# Patient Record
Sex: Female | Born: 1950 | Race: White | Hispanic: No | Marital: Married | Smoking: Former smoker
Health system: Southern US, Community
[De-identification: ages and names within clinical notes are randomized; demographics above are authoritative.]

## PROBLEM LIST (undated history)

## (undated) DIAGNOSIS — M8580 Other specified disorders of bone density and structure, unspecified site: Secondary | ICD-10-CM

## (undated) DIAGNOSIS — J189 Pneumonia, unspecified organism: Secondary | ICD-10-CM

## (undated) DIAGNOSIS — G5602 Carpal tunnel syndrome, left upper limb: Secondary | ICD-10-CM

## (undated) DIAGNOSIS — M1990 Unspecified osteoarthritis, unspecified site: Secondary | ICD-10-CM

## (undated) DIAGNOSIS — G589 Mononeuropathy, unspecified: Secondary | ICD-10-CM

## (undated) DIAGNOSIS — Z8669 Personal history of other diseases of the nervous system and sense organs: Secondary | ICD-10-CM

## (undated) DIAGNOSIS — F8081 Childhood onset fluency disorder: Secondary | ICD-10-CM

## (undated) DIAGNOSIS — J45909 Unspecified asthma, uncomplicated: Secondary | ICD-10-CM

## (undated) HISTORY — PX: TUBAL LIGATION: SHX77

## (undated) HISTORY — PX: KNEE ARTHROSCOPY: SUR90

## (undated) HISTORY — PX: TONSILLECTOMY AND ADENOIDECTOMY: SUR1326

## (undated) HISTORY — PX: SHOULDER SURGERY: SHX246

## (undated) HISTORY — DX: Other specified disorders of bone density and structure, unspecified site: M85.80

## (undated) HISTORY — PX: TRIGGER FINGER RELEASE: SHX641

## (undated) HISTORY — PX: COLONOSCOPY: SHX174

## (undated) HISTORY — PX: ANKLE FRACTURE SURGERY: SHX122

## (undated) HISTORY — PX: CHOLECYSTECTOMY: SHX55

## (undated) HISTORY — PX: DILATION AND CURETTAGE OF UTERUS: SHX78

## (undated) HISTORY — PX: CARPAL TUNNEL RELEASE: SHX101

---

## 1998-09-30 ENCOUNTER — Other Ambulatory Visit: Admission: RE | Admit: 1998-09-30 | Discharge: 1998-09-30 | Payer: Self-pay | Admitting: Gynecology

## 2000-01-11 ENCOUNTER — Other Ambulatory Visit: Admission: RE | Admit: 2000-01-11 | Discharge: 2000-01-11 | Payer: Self-pay | Admitting: Gynecology

## 2001-01-26 ENCOUNTER — Other Ambulatory Visit: Admission: RE | Admit: 2001-01-26 | Discharge: 2001-01-26 | Payer: Self-pay | Admitting: Gynecology

## 2002-02-23 ENCOUNTER — Other Ambulatory Visit: Admission: RE | Admit: 2002-02-23 | Discharge: 2002-02-23 | Payer: Self-pay | Admitting: Gynecology

## 2003-06-18 ENCOUNTER — Other Ambulatory Visit: Admission: RE | Admit: 2003-06-18 | Discharge: 2003-06-18 | Payer: Self-pay | Admitting: Gynecology

## 2004-09-25 ENCOUNTER — Other Ambulatory Visit: Admission: RE | Admit: 2004-09-25 | Discharge: 2004-09-25 | Payer: Self-pay | Admitting: Gynecology

## 2004-10-02 ENCOUNTER — Ambulatory Visit (HOSPITAL_COMMUNITY): Admission: RE | Admit: 2004-10-02 | Discharge: 2004-10-02 | Payer: Self-pay | Admitting: Gastroenterology

## 2005-04-08 ENCOUNTER — Ambulatory Visit (HOSPITAL_BASED_OUTPATIENT_CLINIC_OR_DEPARTMENT_OTHER): Admission: RE | Admit: 2005-04-08 | Discharge: 2005-04-08 | Payer: Self-pay | Admitting: Orthopedic Surgery

## 2005-04-08 ENCOUNTER — Ambulatory Visit (HOSPITAL_COMMUNITY): Admission: RE | Admit: 2005-04-08 | Discharge: 2005-04-08 | Payer: Self-pay | Admitting: Orthopedic Surgery

## 2006-10-20 ENCOUNTER — Other Ambulatory Visit: Admission: RE | Admit: 2006-10-20 | Discharge: 2006-10-20 | Payer: Self-pay | Admitting: Gynecology

## 2007-11-03 ENCOUNTER — Other Ambulatory Visit: Admission: RE | Admit: 2007-11-03 | Discharge: 2007-11-03 | Payer: Self-pay | Admitting: Gynecology

## 2008-11-07 ENCOUNTER — Encounter: Payer: Self-pay | Admitting: Gynecology

## 2008-11-07 ENCOUNTER — Other Ambulatory Visit: Admission: RE | Admit: 2008-11-07 | Discharge: 2008-11-07 | Payer: Self-pay | Admitting: Gynecology

## 2008-11-07 ENCOUNTER — Ambulatory Visit: Payer: Self-pay | Admitting: Gynecology

## 2008-12-02 ENCOUNTER — Ambulatory Visit: Payer: Self-pay | Admitting: Gynecology

## 2009-11-14 ENCOUNTER — Ambulatory Visit: Payer: Self-pay | Admitting: Gynecology

## 2009-11-14 ENCOUNTER — Other Ambulatory Visit: Admission: RE | Admit: 2009-11-14 | Discharge: 2009-11-14 | Payer: Self-pay | Admitting: Gynecology

## 2010-10-09 NOTE — Op Note (Signed)
NAMEGINI, CAPUTO                 ACCOUNT NO.:  [id number]   MEDICAL RECORD NO.:  [id number]          PATIENT TYPE:  AMB   LOCATION:  DSC                          FACILITY:  [facility]   PHYSICIAN:  Cindee Salt, M.D.       DATE OF BIRTH:  [birth year]   DATE OF PROCEDURE:  04/08/2005  DATE OF DISCHARGE:                                 OPERATIVE REPORT   PREOPERATIVE DIAGNOSIS:  Carpal tunnel syndrome, left hand.   POSTOPERATIVE DIAGNOSIS:  Carpal tunnel syndrome, left hand.   OPERATION:  Decompression, left median nerve.   SURGEON:  Cindee Salt, M.D.   ASSISTANT:  Carolyne Fiscal R.N.   ANESTHESIA:  Forearm-based IV regional.   HISTORY:  The patient is a 60 year old female with a history of carpal  tunnel syndrome, EMG nerve conductions positive, which has not responded to  conservative treatment.   PROCEDURE:  The patient was brought to the operating room, where a forearm-  based IV regional anesthetic was carried out without difficulty.  She was  prepped using DuraPrep, supine position, left arm free.  A longitudinal  incision was made in the palm and carried down through subcutaneous tissue.  Bleeders were electrocauterized.  Palmar fascia was split, superficial  palmar arch identified, flexor tendon to the ring and little finger  identified to the ulnar side of the median nerve.  The carpal retinaculum  was incised with sharp dissection.  A right angle and Sewall retractor were  placed between skin forearm fascia.  The fascia was released for  approximately a 1.5 cm proximal to the wrist crease under direct vision.  An  area of compression to the nerve was immediately apparent.  No further  lesions were identified.  Tenosynovial tissue was moderately thickened.  The  wound was irrigated. The skin was closed with interrupted 5-0 nylon sutures.  A sterile compressive dressing and splint was applied.  The patient  tolerated the procedure well and was taken to the recovery room for  observation in satisfactory condition.  She is discharged home to return to  the [facility] [place] in one week on Vicodin.           ______________________________  Cindee Salt, M.D.     GK/MEDQ  D:  04/08/2005  T:  04/09/2005  Job:  [id number]

## 2010-10-09 NOTE — Op Note (Signed)
NAMEMAKENA, MURDOCK                 ACCOUNT NO.:  [id number]   MEDICAL RECORD NO.:  [id number]          PATIENT TYPE:  AMB   LOCATION:  ENDO                         FACILITY:  [facility]   PHYSICIAN:  Graylin Shiver, M.D.   DATE OF BIRTH:  1950-06-20   DATE OF PROCEDURE:  10/02/2004  DATE OF DISCHARGE:                                 OPERATIVE REPORT   PROCEDURE:  Colonoscopy.   INDICATIONS:  Screening.   Informed consent was obtained after explanation of the risks of bleeding,  infection and perforation.   PREMEDICATION:  Fentanyl 125 mcg IV, Versed 12 mg IV.   PROCEDURE:  With the patient in the left lateral decubitus position, a  rectal exam was performed. No masses were felt. The Olympus colonoscope was  inserted into the rectum and advanced around the colon to the cecum. Cecal  landmarks were identified. The cecum and ascending colon were normal. The  transverse colon normal. The descending colon normal. The sigmoid showed  diverticulosis. The sigmoid was very tortuous. The rectum was normal. She  tolerated the procedure well without complications.   IMPRESSION:  Diverticulosis of the sigmoid colon   I would recommend a follow-up screening colonoscopy again in 10 years.      SFG/MEDQ  D:  10/02/2004  T:  10/02/2004  Job:  [id number]   cc:   Dellis Anes. Idell Pickles, M.D.  [address]  [place]  [place] [address]  Fax: [phone]

## 2010-12-04 ENCOUNTER — Other Ambulatory Visit: Payer: Self-pay | Admitting: Gynecology

## 2010-12-04 ENCOUNTER — Other Ambulatory Visit (HOSPITAL_COMMUNITY)
Admission: RE | Admit: 2010-12-04 | Discharge: 2010-12-04 | Disposition: A | Discharge status: Home / Self Care | Payer: Managed Care, Other (non HMO) | Source: Ambulatory Visit | Attending: Gynecology | Admitting: Gynecology

## 2010-12-04 ENCOUNTER — Encounter: Payer: Self-pay | Admitting: Gynecology

## 2010-12-04 ENCOUNTER — Encounter (INDEPENDENT_AMBULATORY_CARE_PROVIDER_SITE_OTHER): Payer: Managed Care, Other (non HMO) | Admitting: Gynecology

## 2010-12-04 DIAGNOSIS — Z01419 Encounter for gynecological examination (general) (routine) without abnormal findings: Secondary | ICD-10-CM

## 2010-12-04 DIAGNOSIS — N952 Postmenopausal atrophic vaginitis: Secondary | ICD-10-CM

## 2010-12-04 DIAGNOSIS — Z124 Encounter for screening for malignant neoplasm of cervix: Secondary | ICD-10-CM | POA: Insufficient documentation

## 2011-01-12 ENCOUNTER — Ambulatory Visit (HOSPITAL_BASED_OUTPATIENT_CLINIC_OR_DEPARTMENT_OTHER)
Admission: RE | Admit: 2011-01-12 | Discharge: 2011-01-12 | Disposition: A | Discharge status: Home / Self Care | Payer: Managed Care, Other (non HMO) | Source: Ambulatory Visit | Attending: Orthopedic Surgery | Admitting: Orthopedic Surgery

## 2011-01-12 ENCOUNTER — Ambulatory Visit (HOSPITAL_COMMUNITY): Payer: Managed Care, Other (non HMO) | Attending: Orthopedic Surgery

## 2011-01-12 DIAGNOSIS — X58XXXA Exposure to other specified factors, initial encounter: Secondary | ICD-10-CM | POA: Insufficient documentation

## 2011-01-12 DIAGNOSIS — S8253XA Displaced fracture of medial malleolus of unspecified tibia, initial encounter for closed fracture: Secondary | ICD-10-CM | POA: Insufficient documentation

## 2011-01-12 DIAGNOSIS — Z79899 Other long term (current) drug therapy: Secondary | ICD-10-CM | POA: Insufficient documentation

## 2011-01-12 DIAGNOSIS — G43909 Migraine, unspecified, not intractable, without status migrainosus: Secondary | ICD-10-CM | POA: Insufficient documentation

## 2011-01-12 LAB — POCT HEMOGLOBIN-HEMACUE: Hemoglobin: 14.4 g/dL (ref 12.0–15.0)

## 2011-01-21 NOTE — Op Note (Signed)
  NAMEAMARACHI, KOTZ                 ACCOUNT NO.:  [id number]  MEDICAL RECORD NO.:  [id number]  LOCATION:                                 FACILITY:  PHYSICIAN:  Marlowe Kays, M.D.  DATE OF BIRTH:  July 02, 1950  DATE OF PROCEDURE:  01/12/2011 DATE OF DISCHARGE:                              OPERATIVE REPORT   PREOPERATIVE DIAGNOSIS:  Closed displaced medial malleolar fracture, right ankle.  POSTOPERATIVE DIAGNOSIS:  Closed displaced medial malleolar fracture, right ankle.  OPERATION:  ORIF of medial malleolar fracture right ankle with internal fixation using a single 3.5 mm x 34 mm fully threaded cannulated screw.  SURGEON:  Marlowe Kays, MD  ASSISTANT:  Druscilla Brownie. Idolina Primer, PA-C.  ANESTHESIA:  General.  PATHOLOGY AND JUSTIFICATION FOR PROCEDURE:  Fracture actually occurred on July 28th.  I saw her in my office 5 days later and performed under local what we thought was a nice closed reduction of the fracture.  On first post a week later, it was still intact, but on most recent x-rays of August 17th, the fracture had displaced once again to its original position and accordingly, she is here today for the above-mentioned surgery.  PROCEDURE IN DETAIL:  Prophylactic antibiotics, satisfied general anesthesia, pneumatic tourniquet with the right leg Esmarched out nonsterilely and tourniquet inflated to 300 mmHg.  Right leg was prepped with DuraPrep from midcalf to toes and draped in sterile field.  Time- out performed.  I made a curved incision just anterior to the medial malleolus curving beneath it and with careful blunt and sharp dissection, worked my way down to the fracture site.  Rudimentary healing was already present.  By freeing up the early healing with sharp dissection, I was unable to mobilize the fragment and hold it, we felt was in anatomic position and I placed a cannulated screw pin in the 2 fragments.  Initial reduction was not satisfactory as I like and  we repeated this twice until I had the fragment in what I felt was the best position obtainable which seemed to be anatomic.  I then overdrilled this and used the fully threaded 35 mm long, 3.5 mm cannulated screw over the guide pin, tightening it down.  Final x-rays demonstrated anatomic reduction and clinically appeared to be very stable.  Wound was irrigated with sterile saline.  Soft tissues and the joint were infiltrated with 0.5% plain Marcaine.  The wound was then closed in layers with interrupted 2-0 Vicryl in the periosteum over the fracture site and in the medial ankle capsule.  Subcutaneous tissue was closed with the same interrupted 4-0 nylon mattress sutures and skin.  Betaine, Adaptic dry sterile dressing were applied followed by a very well- padded, short-leg fiberglass cast.  She tolerated the procedure well. At the time of this dictation was on her way to recovery room in satisfactory condition with no known complications.          ______________________________ Marlowe Kays, M.D.     JA/MEDQ  D:  01/12/2011  T:  01/13/2011  Job:  [id number]  Electronically Signed by Marlowe Kays M.D. on 01/21/2011 10:52:51 AM

## 2011-03-10 ENCOUNTER — Encounter: Payer: Self-pay | Admitting: Gynecology

## 2011-12-10 ENCOUNTER — Ambulatory Visit (INDEPENDENT_AMBULATORY_CARE_PROVIDER_SITE_OTHER): Payer: Managed Care, Other (non HMO) | Admitting: Gynecology

## 2011-12-10 ENCOUNTER — Encounter: Payer: Self-pay | Admitting: Gynecology

## 2011-12-10 VITALS — BP 124/74 | Ht 61.5 in | Wt 134.0 lb

## 2011-12-10 DIAGNOSIS — M8580 Other specified disorders of bone density and structure, unspecified site: Secondary | ICD-10-CM | POA: Insufficient documentation

## 2011-12-10 DIAGNOSIS — Z01419 Encounter for gynecological examination (general) (routine) without abnormal findings: Secondary | ICD-10-CM

## 2011-12-10 NOTE — Patient Instructions (Addendum)
Follow up in one year for annual gynecologic exam. 

## 2011-12-10 NOTE — Progress Notes (Signed)
Cynthia [name] [birth year] [id number]        61 y.o.  G6P0060 for annual exam.  Doing well without complaints.  Past medical history,surgical history, medications, allergies, family history and social history were all reviewed and documented in the EPIC chart. ROS:  Was performed and pertinent positives and negatives are included in the history.  Exam: Kim assistant Filed Vitals:   12/10/11 1033  BP: 124/74  Height: 5' 1.5" (1.562 m)  Weight: 134 lb (60.782 kg)   General appearance  Normal Skin grossly normal Head/Neck normal with no cervical or supraclavicular adenopathy thyroid normal Lungs  clear Cardiac RR, without RMG Abdominal  soft, nontender, without masses, organomegaly or hernia Breasts  examined lying and sitting without masses, retractions, discharge or axillary adenopathy. Pelvic  Ext/BUS/vagina  normal with atrophic changes  Cervix  normal with atrophic changes  Uterus  axial, normal size, shape and contour, midline and mobile nontender   Adnexa  Without masses or tenderness    Anus and perineum  normal   Rectovaginal  normal sphincter tone without palpated masses or tenderness.    Assessment/Plan:  61 y.o. [name] female for annual exam.   1. Postmenopausal symptoms. Patient having some hot flashes. Had been on HRT in the past but does not want to restart this. Has tried OTC soy without much success. She would prefer monitoring at present. 2. Atrophic genital changes. Asymptomatic. We'll continue to monitor. 3. Mammography. Patient due this fall and I reminded her to schedule it. SBE monthly reviewed. 4. Pap smear. Last Pap smear 2012. No Pap smear done today.  No history of abnormal Pap smears with numerous normal reports in her chart. Discussed current screening guidelines we'll plan every 3-5 your screening. 5. DEXA.  DEXA 11/2008 normal. We'll plan repeat DEXA at five-year interval in 2015. Increase calcium vitamin D reviewed. 6. Colonoscopy.  Colonoscopy reported  8 years ago. Recommended stool guaiac cards now and patient declined.  She understands the importance of screening and declines. 7. Health maintenance. No blood work done today as it is all done through Dr. Ferd Hibbs office. Assuming she continues well from a gynecologic standpoint she will see [place] in a year, sooner as needed.    Dara Lords MD, 11:15 AM 12/10/2011

## 2011-12-11 LAB — URINALYSIS W MICROSCOPIC + REFLEX CULTURE
Bacteria, UA: NONE SEEN
Casts: NONE SEEN
Crystals: NONE SEEN
Glucose, UA: NEGATIVE mg/dL
Hgb urine dipstick: NEGATIVE
Ketones, ur: NEGATIVE mg/dL
Nitrite: NEGATIVE
Specific Gravity, Urine: 1.014 (ref 1.005–1.030)
pH: 6.5 (ref 5.0–8.0)

## 2012-06-08 ENCOUNTER — Other Ambulatory Visit: Payer: Self-pay | Admitting: Dermatology

## 2012-08-08 ENCOUNTER — Encounter: Payer: Self-pay | Admitting: Gynecology

## 2012-10-05 ENCOUNTER — Emergency Department (HOSPITAL_COMMUNITY): Payer: Managed Care, Other (non HMO)

## 2012-10-05 ENCOUNTER — Emergency Department (HOSPITAL_COMMUNITY)
Admission: EM | Admit: 2012-10-05 | Discharge: 2012-10-05 | Disposition: A | Discharge status: Home / Self Care | Payer: Managed Care, Other (non HMO) | Attending: Emergency Medicine | Admitting: Emergency Medicine

## 2012-10-05 ENCOUNTER — Encounter (HOSPITAL_COMMUNITY): Payer: Self-pay

## 2012-10-05 DIAGNOSIS — Y939 Activity, unspecified: Secondary | ICD-10-CM | POA: Insufficient documentation

## 2012-10-05 DIAGNOSIS — Z8739 Personal history of other diseases of the musculoskeletal system and connective tissue: Secondary | ICD-10-CM | POA: Insufficient documentation

## 2012-10-05 DIAGNOSIS — Z79899 Other long term (current) drug therapy: Secondary | ICD-10-CM | POA: Insufficient documentation

## 2012-10-05 DIAGNOSIS — Y92410 Unspecified street and highway as the place of occurrence of the external cause: Secondary | ICD-10-CM | POA: Insufficient documentation

## 2012-10-05 DIAGNOSIS — Z7982 Long term (current) use of aspirin: Secondary | ICD-10-CM | POA: Insufficient documentation

## 2012-10-05 DIAGNOSIS — S298XXA Other specified injuries of thorax, initial encounter: Secondary | ICD-10-CM | POA: Insufficient documentation

## 2012-10-05 DIAGNOSIS — Z87891 Personal history of nicotine dependence: Secondary | ICD-10-CM | POA: Insufficient documentation

## 2012-10-05 MED ORDER — ACETAMINOPHEN 500 MG PO TABS
1000.0000 mg | ORAL_TABLET | Freq: Once | ORAL | Status: AC
  Administered 2012-10-05: 1000 mg via ORAL
  Filled 2012-10-05: qty 2

## 2012-10-05 MED ORDER — DIAZEPAM 5 MG PO TABS: ORAL_TABLET | ORAL | Status: DC

## 2012-10-05 MED ORDER — NAPROXEN 500 MG PO TABS: ORAL_TABLET | ORAL | Status: DC
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
## 2012-10-06 NOTE — ED Provider Notes (Signed)
Medical screening examination/treatment/procedure(s) were performed by non-physician practitioner and as supervising physician I was immediately available for consultation/collaboration.   Christopher J. Pollina, MD 10/06/12 0020 

## 2013-03-02 ENCOUNTER — Encounter: Payer: Self-pay | Admitting: Gynecology

## 2013-03-02 ENCOUNTER — Ambulatory Visit (INDEPENDENT_AMBULATORY_CARE_PROVIDER_SITE_OTHER): Payer: Managed Care, Other (non HMO) | Admitting: Gynecology

## 2013-03-02 VITALS — BP 120/70 | Ht 61.5 in | Wt 125.0 lb

## 2013-03-02 DIAGNOSIS — Z01419 Encounter for gynecological examination (general) (routine) without abnormal findings: Secondary | ICD-10-CM

## 2013-03-02 NOTE — Patient Instructions (Signed)
Follow up in one year, sooner as needed. 

## 2013-03-02 NOTE — Progress Notes (Signed)
Cynthia [name] 1950-07-22 [id number]        62 y.o.  G6P0060 for annual exam.  Several issues noted below.  Past medical history,surgical history, medications, allergies, family history and social history were all reviewed and documented in the EPIC chart.  ROS:  Performed and pertinent positives and negatives are included in the history, assessment and plan .  Exam: Kim assistant Filed Vitals:   03/02/13 1057  BP: 120/70  Height: 5' 1.5" (1.562 m)  Weight: 125 lb (56.7 kg)   General appearance  Normal Skin grossly normal Head/Neck normal with no cervical or supraclavicular adenopathy thyroid normal Lungs  clear Cardiac RR, without RMG Abdominal  soft, nontender, without masses, organomegaly or hernia Breasts  examined lying and sitting without masses, retractions, discharge or axillary adenopathy. Pelvic  Ext/BUS/vagina  normal with atrophic changes  Cervix  normal with atrophic changes  Uterus  anteverted, normal size, shape and contour, midline and mobile nontender   Adnexa  Without masses or tenderness    Anus and perineum  normal   Rectovaginal  normal sphincter tone without palpated masses or tenderness.    Assessment/Plan:  62 y.o. G81P0060 female for annual exam.   1. Postmenopausal/menopausal symptoms/atrophic changes. Patient still having hot flashes and night sweats. Has tried OTC products without success. Reviewed HRT and nonhormonal prescription based such as Effexor. She is not interested and prefers just to monitor. Not having significant vaginal symptoms. Knows to report any vaginal bleeding. 2. Mammography 07/2012. Continue with annual mammography. SBE monthly reviewed. 3. Pap smear 2012. No Pap smear done today. No history of abnormal Pap smears previously. Plan repeat Pap smear next year 3 year interval. 4. DEXA 2010 normal. Prior osteopenia. Plan repeat next year 5 year interval. Increase calcium vitamin D reviewed. 5. Colonoscopy 8 years ago. Planned repeat at 10  year interval. 6. Health maintenance. No blood work done as this is all done through her primary physician's office. Followup one year, sooner as needed.  Note: This document was prepared with digital dictation and possible smart phrase technology. Any transcriptional errors that result from this process are unintentional.   Dara Lords MD, 11:20 AM 03/02/2013

## 2013-03-03 LAB — URINALYSIS W MICROSCOPIC + REFLEX CULTURE
Bacteria, UA: NONE SEEN
Casts: NONE SEEN
Glucose, UA: NEGATIVE mg/dL
Hgb urine dipstick: NEGATIVE
Ketones, ur: NEGATIVE mg/dL
Protein, ur: NEGATIVE mg/dL
pH: 7 (ref 5.0–8.0)

## 2013-08-17 ENCOUNTER — Encounter: Payer: Self-pay | Admitting: Gynecology

## 2014-03-08 ENCOUNTER — Encounter: Payer: Managed Care, Other (non HMO) | Admitting: Gynecology

## 2014-03-15 ENCOUNTER — Ambulatory Visit (INDEPENDENT_AMBULATORY_CARE_PROVIDER_SITE_OTHER): Payer: Medicare Other | Admitting: Gynecology

## 2014-03-15 ENCOUNTER — Other Ambulatory Visit (HOSPITAL_COMMUNITY)
Admission: RE | Admit: 2014-03-15 | Discharge: 2014-03-15 | Disposition: A | Discharge status: Home / Self Care | Payer: Medicare Other | Source: Ambulatory Visit | Attending: Gynecology | Admitting: Gynecology

## 2014-03-15 ENCOUNTER — Encounter: Payer: Self-pay | Admitting: Gynecology

## 2014-03-15 VITALS — BP 124/74 | Ht 61.0 in | Wt 125.0 lb

## 2014-03-15 DIAGNOSIS — Z01419 Encounter for gynecological examination (general) (routine) without abnormal findings: Secondary | ICD-10-CM | POA: Insufficient documentation

## 2014-03-15 DIAGNOSIS — M8580 Other specified disorders of bone density and structure, unspecified site: Secondary | ICD-10-CM | POA: Diagnosis not present

## 2014-03-15 DIAGNOSIS — Z124 Encounter for screening for malignant neoplasm of cervix: Secondary | ICD-10-CM

## 2014-03-15 DIAGNOSIS — N952 Postmenopausal atrophic vaginitis: Secondary | ICD-10-CM

## 2014-03-15 DIAGNOSIS — R8299 Other abnormal findings in urine: Secondary | ICD-10-CM | POA: Diagnosis not present

## 2014-03-15 NOTE — Addendum Note (Signed)
Addended by: Nelva Nay on: 03/15/2014 09:39 AM   Modules accepted: Orders

## 2014-03-15 NOTE — Progress Notes (Signed)
Cynthia [name] [birth year] [id number]        63 y.o.  G6P0060 for follow up exam. Several issues noted below.  Past medical history,surgical history, problem list, medications, allergies, family history and social history were all reviewed and documented as reviewed in the EPIC chart.  ROS:  12 system ROS performed with pertinent positives and negatives included in the history, assessment and plan.   Additional significant findings :  none   Exam: Kim Counsellor Vitals:   03/15/14 0847  BP: 124/74  Height: 5\' 1"  (1.549 m)  Weight: 125 lb (56.7 kg)   General appearance:  Normal affect, orientation and appearance. Skin: Grossly normal HEENT: Without gross lesions.  No cervical or supraclavicular adenopathy. Thyroid normal.  Lungs:  Clear without wheezing, rales or rhonchi Cardiac: RR, without RMG Abdominal:  Soft, nontender, without masses, guarding, rebound, organomegaly or hernia Breasts:  Examined lying and sitting without masses, retractions, discharge or axillary adenopathy. Pelvic:  Ext/BUS/vagina was significant atrophic changes.  Cervix atrophic plus with upper vagina  Uterus axial to anteverted, normal size, shape and contour, midline and mobile nontender   Adnexa  Without masses or tenderness    Anus and perineum  Normal   Rectovaginal  Normal sphincter tone without palpated masses or tenderness.    Assessment/Plan:  63 y.o. G8P0060 female for follow up exam.   1. Postmenopausal/significant atrophic changes. The Pap smear cause a little bleeding due to the mucosal fragility.  Patient is not sexually active. Denies daily atrophic symptoms. We'll monitor for now and follow up if any issues. Not having hot flashes, night sweats or any vaginal bleeding. Patient does report any vaginal bleeding. 2. Osteopenia. History of osteopenia in the past with T score -1.9 2006, -1.3 2008 and her last DEXA 2010 was normal. Recommend repeat DEXA now at the 5 year interval. Increased  calcium vitamin D reviewed. 3. Pap smear 2012. Pap smear done today. No history of significant abnormal Pap smears previously. Options to stop screening altogether assuming this Pap smear is normal per current screening guidelines past she has turned 63 versus less frequent screening intervals reviewed. Will readdress on an annual basis. 4. Mammography 07/2013. Continue with annual mammography. SBE monthly reviewed. 5. Colonoscopy due next year and she knows to follow up for this. 6. Health maintenance. No routine blood work done as she reports this done through her primary physician's office. Follow up for the bone density otherwise annually    Anastasio Auerbach MD, 9:08 AM 03/15/2014

## 2014-03-15 NOTE — Patient Instructions (Signed)
You may obtain a copy of any labs that were done today by logging onto MyChart as outlined in the instructions provided with your AVS (after visit summary). The office will not call with normal lab results but certainly if there are any significant abnormalities then we will contact you.   Health Maintenance, Female A healthy lifestyle and preventative care can promote health and wellness.  Maintain regular health, dental, and eye exams.  Eat a healthy diet. Foods like vegetables, fruits, whole grains, low-fat dairy products, and lean protein foods contain the nutrients you need without too many calories. Decrease your intake of foods high in solid fats, added sugars, and salt. Get information about a proper diet from your caregiver, if necessary.  Regular physical exercise is one of the most important things you can do for your health. Most adults should get at least 150 minutes of moderate-intensity exercise (any activity that increases your heart rate and causes you to sweat) each week. In addition, most adults need muscle-strengthening exercises on 2 or more days a week.   Maintain a healthy weight. The body mass index (BMI) is a screening tool to identify possible weight problems. It provides an estimate of body fat based on height and weight. Your caregiver can help determine your BMI, and can help you achieve or maintain a healthy weight. For adults 20 years and older:  A BMI below 18.5 is considered underweight.  A BMI of 18.5 to 24.9 is normal.  A BMI of 25 to 29.9 is considered overweight.  A BMI of 30 and above is considered obese.  Maintain normal blood lipids and cholesterol by exercising and minimizing your intake of saturated fat. Eat a balanced diet with plenty of fruits and vegetables. Blood tests for lipids and cholesterol should begin at age 61 and be repeated every 5 years. If your lipid or cholesterol levels are high, you are over 50, or you are a high risk for heart  disease, you may need your cholesterol levels checked more frequently.Ongoing high lipid and cholesterol levels should be treated with medicines if diet and exercise are not effective.  If you smoke, find out from your caregiver how to quit. If you do not use tobacco, do not start.  Lung cancer screening is recommended for adults aged 33 80 years who are at high risk for developing lung cancer because of a history of smoking. Yearly low-dose computed tomography (CT) is recommended for people who have at least a 30-pack-year history of smoking and are a current smoker or have quit within the past 15 years. A pack year of smoking is smoking an average of 1 pack of cigarettes a day for 1 year (for example: 1 pack a day for 30 years or 2 packs a day for 15 years). Yearly screening should continue until the smoker has stopped smoking for at least 15 years. Yearly screening should also be stopped for people who develop a health problem that would prevent them from having lung cancer treatment.  If you are pregnant, do not drink alcohol. If you are breastfeeding, be very cautious about drinking alcohol. If you are not pregnant and choose to drink alcohol, do not exceed 1 drink per day. One drink is considered to be 12 ounces (355 mL) of beer, 5 ounces (148 mL) of wine, or 1.5 ounces (44 mL) of liquor.  Avoid use of street drugs. Do not share needles with anyone. Ask for help if you need support or instructions about stopping  the use of drugs.  High blood pressure causes heart disease and increases the risk of stroke. Blood pressure should be checked at least every 1 to 2 years. Ongoing high blood pressure should be treated with medicines, if weight loss and exercise are not effective.  If you are 59 to 64 years old, ask your caregiver if you should take aspirin to prevent strokes.  Diabetes screening involves taking a blood sample to check your fasting blood sugar level. This should be done once every 3  years, after age 91, if you are within normal weight and without risk factors for diabetes. Testing should be considered at a younger age or be carried out more frequently if you are overweight and have at least 1 risk factor for diabetes.  Breast cancer screening is essential preventative care for women. You should practice "breast self-awareness." This means understanding the normal appearance and feel of your breasts and may include breast self-examination. Any changes detected, no matter how small, should be reported to a caregiver. Women in their 66s and 30s should have a clinical breast exam (CBE) by a caregiver as part of a regular health exam every 1 to 3 years. After age 101, women should have a CBE every year. Starting at age 100, women should consider having a mammogram (breast X-ray) every year. Women who have a family history of breast cancer should talk to their caregiver about genetic screening. Women at a high risk of breast cancer should talk to their caregiver about having an MRI and a mammogram every year.  Breast cancer gene (BRCA)-related cancer risk assessment is recommended for women who have family members with BRCA-related cancers. BRCA-related cancers include breast, ovarian, tubal, and peritoneal cancers. Having family members with these cancers may be associated with an increased risk for harmful changes (mutations) in the breast cancer genes BRCA1 and BRCA2. Results of the assessment will determine the need for genetic counseling and BRCA1 and BRCA2 testing.  The Pap test is a screening test for cervical cancer. Women should have a Pap test starting at age 57. Between ages 25 and 35, Pap tests should be repeated every 2 years. Beginning at age 37, you should have a Pap test every 3 years as long as the past 3 Pap tests have been normal. If you had a hysterectomy for a problem that was not cancer or a condition that could lead to cancer, then you no longer need Pap tests. If you are  between ages 50 and 76, and you have had normal Pap tests going back 10 years, you no longer need Pap tests. If you have had past treatment for cervical cancer or a condition that could lead to cancer, you need Pap tests and screening for cancer for at least 20 years after your treatment. If Pap tests have been discontinued, risk factors (such as a new sexual partner) need to be reassessed to determine if screening should be resumed. Some women have medical problems that increase the chance of getting cervical cancer. In these cases, your caregiver may recommend more frequent screening and Pap tests.  The human papillomavirus (HPV) test is an additional test that may be used for cervical cancer screening. The HPV test looks for the virus that can cause the cell changes on the cervix. The cells collected during the Pap test can be tested for HPV. The HPV test could be used to screen women aged 44 years and older, and should be used in women of any age  who have unclear Pap test results. After the age of 55, women should have HPV testing at the same frequency as a Pap test.  Colorectal cancer can be detected and often prevented. Most routine colorectal cancer screening begins at the age of 44 and continues through age 20. However, your caregiver may recommend screening at an earlier age if you have risk factors for colon cancer. On a yearly basis, your caregiver may provide home test kits to check for hidden blood in the stool. Use of a small camera at the end of a tube, to directly examine the colon (sigmoidoscopy or colonoscopy), can detect the earliest forms of colorectal cancer. Talk to your caregiver about this at age 86, when routine screening begins. Direct examination of the colon should be repeated every 5 to 10 years through age 13, unless early forms of pre-cancerous polyps or small growths are found.  Hepatitis C blood testing is recommended for all people born from [birth year] through 1967 and any  individual with known risks for hepatitis C.  Practice safe sex. Use condoms and avoid high-risk sexual practices to reduce the spread of sexually transmitted infections (STIs). Sexually active women aged 36 and younger should be checked for Chlamydia, which is a common sexually transmitted infection. Older women with new or multiple partners should also be tested for Chlamydia. Testing for other STIs is recommended if you are sexually active and at increased risk.  Osteoporosis is a disease in which the bones lose minerals and strength with aging. This can result in serious bone fractures. The risk of osteoporosis can be identified using a bone density scan. Women ages 20 and over and women at risk for fractures or osteoporosis should discuss screening with their caregivers. Ask your caregiver whether you should be taking a calcium supplement or vitamin D to reduce the rate of osteoporosis.  Menopause can be associated with physical symptoms and risks. Hormone replacement therapy is available to decrease symptoms and risks. You should talk to your caregiver about whether hormone replacement therapy is right for you.  Use sunscreen. Apply sunscreen liberally and repeatedly throughout the day. You should seek shade when your shadow is shorter than you. Protect yourself by wearing long sleeves, pants, a wide-brimmed hat, and sunglasses year round, whenever you are outdoors.  Notify your caregiver of new moles or changes in moles, especially if there is a change in shape or color. Also notify your caregiver if a mole is larger than the size of a pencil eraser.  Stay current with your immunizations. Document Released: 11/23/2010 Document Revised: 09/04/2012 Document Reviewed: 11/23/2010 [facility] Patient Information 2014 [employer].

## 2014-03-16 LAB — URINALYSIS W MICROSCOPIC + REFLEX CULTURE
BILIRUBIN URINE: NEGATIVE
CASTS: NONE SEEN
CRYSTALS: NONE SEEN
GLUCOSE, UA: NEGATIVE mg/dL
Hgb urine dipstick: NEGATIVE
KETONES UR: NEGATIVE mg/dL
Leukocytes, UA: NEGATIVE
Nitrite: NEGATIVE
PH: 6 (ref 5.0–8.0)
Protein, ur: NEGATIVE mg/dL
SPECIFIC GRAVITY, URINE: 1.018 (ref 1.005–1.030)
SQUAMOUS EPITHELIAL / LPF: NONE SEEN
Urobilinogen, UA: 0.2 mg/dL (ref 0.0–1.0)

## 2014-03-18 ENCOUNTER — Other Ambulatory Visit: Payer: Self-pay | Admitting: *Deleted

## 2014-03-18 LAB — CYTOLOGY - PAP

## 2014-03-18 MED ORDER — CIPROFLOXACIN HCL 250 MG PO TABS: 250.0000 mg | ORAL_TABLET | Freq: Two times a day (BID) | ORAL | Status: DC

## 2014-03-19 LAB — URINE CULTURE: Colony Count: 85000

## 2014-03-25 ENCOUNTER — Encounter: Payer: Self-pay | Admitting: Gynecology

## 2014-04-22 ENCOUNTER — Ambulatory Visit (INDEPENDENT_AMBULATORY_CARE_PROVIDER_SITE_OTHER): Payer: Medicare Other

## 2014-04-22 DIAGNOSIS — M8580 Other specified disorders of bone density and structure, unspecified site: Secondary | ICD-10-CM | POA: Diagnosis not present

## 2014-04-22 HISTORY — DX: Other specified disorders of bone density and structure, unspecified site: M85.80

## 2014-04-23 ENCOUNTER — Encounter: Payer: Self-pay | Admitting: Gynecology

## 2014-05-03 DIAGNOSIS — M5412 Radiculopathy, cervical region: Secondary | ICD-10-CM | POA: Diagnosis not present

## 2014-05-03 DIAGNOSIS — M5030 Other cervical disc degeneration, unspecified cervical region: Secondary | ICD-10-CM | POA: Diagnosis not present

## 2014-05-03 DIAGNOSIS — M542 Cervicalgia: Secondary | ICD-10-CM | POA: Diagnosis not present

## 2014-05-03 DIAGNOSIS — Z6823 Body mass index (BMI) 23.0-23.9, adult: Secondary | ICD-10-CM | POA: Diagnosis not present

## 2014-05-06 DIAGNOSIS — M2578 Osteophyte, vertebrae: Secondary | ICD-10-CM | POA: Diagnosis not present

## 2014-05-06 DIAGNOSIS — M4312 Spondylolisthesis, cervical region: Secondary | ICD-10-CM | POA: Diagnosis not present

## 2014-05-06 DIAGNOSIS — M5012 Cervical disc disorder with radiculopathy, mid-cervical region: Secondary | ICD-10-CM | POA: Diagnosis not present

## 2014-05-31 DIAGNOSIS — R030 Elevated blood-pressure reading, without diagnosis of hypertension: Secondary | ICD-10-CM | POA: Diagnosis not present

## 2014-05-31 DIAGNOSIS — M5030 Other cervical disc degeneration, unspecified cervical region: Secondary | ICD-10-CM | POA: Diagnosis not present

## 2014-05-31 DIAGNOSIS — M4722 Other spondylosis with radiculopathy, cervical region: Secondary | ICD-10-CM | POA: Diagnosis not present

## 2014-05-31 DIAGNOSIS — M4312 Spondylolisthesis, cervical region: Secondary | ICD-10-CM | POA: Diagnosis not present

## 2014-05-31 DIAGNOSIS — M542 Cervicalgia: Secondary | ICD-10-CM | POA: Diagnosis not present

## 2014-05-31 DIAGNOSIS — M5412 Radiculopathy, cervical region: Secondary | ICD-10-CM | POA: Diagnosis not present

## 2014-05-31 DIAGNOSIS — G5601 Carpal tunnel syndrome, right upper limb: Secondary | ICD-10-CM | POA: Diagnosis not present

## 2014-06-21 ENCOUNTER — Telehealth: Payer: Self-pay | Admitting: *Deleted

## 2014-06-21 NOTE — Telephone Encounter (Signed)
Pt called c/o hemorrhoid asked if you could recommend and MD for to see for consult to have removed? Please advise

## 2014-06-25 NOTE — Telephone Encounter (Signed)
I would check with [facility] as to who they recommend for hemorrhoids.

## 2014-06-27 NOTE — Telephone Encounter (Signed)
Left the name of Leighton Ruff on voicemail and number to call.

## 2014-09-13 DIAGNOSIS — H2513 Age-related nuclear cataract, bilateral: Secondary | ICD-10-CM | POA: Diagnosis not present

## 2014-09-13 DIAGNOSIS — H5010 Unspecified exotropia: Secondary | ICD-10-CM | POA: Diagnosis not present

## 2014-10-17 DIAGNOSIS — Z1231 Encounter for screening mammogram for malignant neoplasm of breast: Secondary | ICD-10-CM | POA: Diagnosis not present

## 2014-10-17 DIAGNOSIS — Z803 Family history of malignant neoplasm of breast: Secondary | ICD-10-CM | POA: Diagnosis not present

## 2014-10-18 ENCOUNTER — Encounter: Payer: Self-pay | Admitting: Gynecology

## 2014-11-01 DIAGNOSIS — R1310 Dysphagia, unspecified: Secondary | ICD-10-CM | POA: Diagnosis not present

## 2014-11-01 DIAGNOSIS — Z0000 Encounter for general adult medical examination without abnormal findings: Secondary | ICD-10-CM | POA: Diagnosis not present

## 2014-11-04 DIAGNOSIS — D649 Anemia, unspecified: Secondary | ICD-10-CM | POA: Diagnosis not present

## 2014-11-08 DIAGNOSIS — M5412 Radiculopathy, cervical region: Secondary | ICD-10-CM | POA: Diagnosis not present

## 2014-11-08 DIAGNOSIS — Z0000 Encounter for general adult medical examination without abnormal findings: Secondary | ICD-10-CM | POA: Diagnosis not present

## 2014-11-08 DIAGNOSIS — M8580 Other specified disorders of bone density and structure, unspecified site: Secondary | ICD-10-CM | POA: Diagnosis not present

## 2014-11-08 DIAGNOSIS — D649 Anemia, unspecified: Secondary | ICD-10-CM | POA: Diagnosis not present

## 2014-11-08 DIAGNOSIS — R1310 Dysphagia, unspecified: Secondary | ICD-10-CM | POA: Diagnosis not present

## 2014-11-08 DIAGNOSIS — Z6823 Body mass index (BMI) 23.0-23.9, adult: Secondary | ICD-10-CM | POA: Diagnosis not present

## 2014-11-08 DIAGNOSIS — Z1389 Encounter for screening for other disorder: Secondary | ICD-10-CM | POA: Diagnosis not present

## 2014-11-08 DIAGNOSIS — G43909 Migraine, unspecified, not intractable, without status migrainosus: Secondary | ICD-10-CM | POA: Diagnosis not present

## 2014-11-08 DIAGNOSIS — Z23 Encounter for immunization: Secondary | ICD-10-CM | POA: Diagnosis not present

## 2014-11-19 DIAGNOSIS — M25511 Pain in right shoulder: Secondary | ICD-10-CM | POA: Diagnosis not present

## 2014-11-29 ENCOUNTER — Other Ambulatory Visit: Payer: Self-pay | Admitting: Gastroenterology

## 2014-11-29 DIAGNOSIS — R1310 Dysphagia, unspecified: Secondary | ICD-10-CM

## 2014-11-29 DIAGNOSIS — Z1211 Encounter for screening for malignant neoplasm of colon: Secondary | ICD-10-CM | POA: Diagnosis not present

## 2014-12-13 ENCOUNTER — Other Ambulatory Visit: Payer: Medicare Other

## 2015-01-01 ENCOUNTER — Other Ambulatory Visit: Payer: Self-pay | Admitting: Orthopaedic Surgery

## 2015-01-01 DIAGNOSIS — M25511 Pain in right shoulder: Secondary | ICD-10-CM

## 2015-01-03 ENCOUNTER — Encounter (INDEPENDENT_AMBULATORY_CARE_PROVIDER_SITE_OTHER): Payer: Self-pay

## 2015-01-03 ENCOUNTER — Ambulatory Visit
Admission: RE | Admit: 2015-01-03 | Discharge: 2015-01-03 | Disposition: A | Discharge status: Home / Self Care | Payer: Medicare Other | Source: Ambulatory Visit | Attending: Gastroenterology | Admitting: Gastroenterology

## 2015-01-03 DIAGNOSIS — R1310 Dysphagia, unspecified: Secondary | ICD-10-CM

## 2015-01-03 DIAGNOSIS — R1319 Other dysphagia: Secondary | ICD-10-CM | POA: Diagnosis not present

## 2015-01-17 DIAGNOSIS — K5730 Diverticulosis of large intestine without perforation or abscess without bleeding: Secondary | ICD-10-CM | POA: Diagnosis not present

## 2015-01-17 DIAGNOSIS — K648 Other hemorrhoids: Secondary | ICD-10-CM | POA: Diagnosis not present

## 2015-01-17 DIAGNOSIS — Z1211 Encounter for screening for malignant neoplasm of colon: Secondary | ICD-10-CM | POA: Diagnosis not present

## 2015-01-23 ENCOUNTER — Ambulatory Visit
Admission: RE | Admit: 2015-01-23 | Discharge: 2015-01-23 | Disposition: A | Discharge status: Home / Self Care | Payer: Medicare Other | Source: Ambulatory Visit | Attending: Orthopaedic Surgery | Admitting: Orthopaedic Surgery

## 2015-01-23 DIAGNOSIS — M25511 Pain in right shoulder: Secondary | ICD-10-CM | POA: Diagnosis not present

## 2015-01-24 DIAGNOSIS — M1811 Unilateral primary osteoarthritis of first carpometacarpal joint, right hand: Secondary | ICD-10-CM | POA: Diagnosis not present

## 2015-01-28 DIAGNOSIS — M7541 Impingement syndrome of right shoulder: Secondary | ICD-10-CM | POA: Diagnosis not present

## 2015-01-28 DIAGNOSIS — M75121 Complete rotator cuff tear or rupture of right shoulder, not specified as traumatic: Secondary | ICD-10-CM | POA: Diagnosis not present

## 2015-01-28 DIAGNOSIS — M25511 Pain in right shoulder: Secondary | ICD-10-CM | POA: Diagnosis not present

## 2015-02-05 ENCOUNTER — Encounter (HOSPITAL_BASED_OUTPATIENT_CLINIC_OR_DEPARTMENT_OTHER): Payer: Self-pay | Admitting: *Deleted
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
## 2015-02-13 ENCOUNTER — Ambulatory Visit (HOSPITAL_BASED_OUTPATIENT_CLINIC_OR_DEPARTMENT_OTHER)
Admission: RE | Admit: 2015-02-13 | Discharge: 2015-02-13 | Disposition: A | Discharge status: Home / Self Care | Payer: Medicare Other | Source: Ambulatory Visit | Attending: Orthopaedic Surgery | Admitting: Orthopaedic Surgery

## 2015-02-13 ENCOUNTER — Ambulatory Visit (HOSPITAL_BASED_OUTPATIENT_CLINIC_OR_DEPARTMENT_OTHER): Payer: Medicare Other | Admitting: Anesthesiology

## 2015-02-13 ENCOUNTER — Encounter (HOSPITAL_BASED_OUTPATIENT_CLINIC_OR_DEPARTMENT_OTHER)
Admission: RE | Disposition: A | Discharge status: Home / Self Care | Payer: Self-pay | Source: Ambulatory Visit | Attending: Orthopaedic Surgery

## 2015-02-13 ENCOUNTER — Encounter (HOSPITAL_BASED_OUTPATIENT_CLINIC_OR_DEPARTMENT_OTHER): Payer: Self-pay | Admitting: Anesthesiology

## 2015-02-13 DIAGNOSIS — M25511 Pain in right shoulder: Secondary | ICD-10-CM | POA: Diagnosis not present

## 2015-02-13 DIAGNOSIS — M75101 Unspecified rotator cuff tear or rupture of right shoulder, not specified as traumatic: Secondary | ICD-10-CM | POA: Diagnosis not present

## 2015-02-13 DIAGNOSIS — Z7982 Long term (current) use of aspirin: Secondary | ICD-10-CM | POA: Diagnosis not present

## 2015-02-13 DIAGNOSIS — Z87891 Personal history of nicotine dependence: Secondary | ICD-10-CM | POA: Insufficient documentation

## 2015-02-13 DIAGNOSIS — M65811 Other synovitis and tenosynovitis, right shoulder: Secondary | ICD-10-CM | POA: Diagnosis not present

## 2015-02-13 DIAGNOSIS — G8918 Other acute postprocedural pain: Secondary | ICD-10-CM | POA: Diagnosis not present

## 2015-02-13 DIAGNOSIS — M75111 Incomplete rotator cuff tear or rupture of right shoulder, not specified as traumatic: Secondary | ICD-10-CM | POA: Insufficient documentation

## 2015-02-13 DIAGNOSIS — M7541 Impingement syndrome of right shoulder: Secondary | ICD-10-CM | POA: Diagnosis not present

## 2015-02-13 DIAGNOSIS — M19012 Primary osteoarthritis, left shoulder: Secondary | ICD-10-CM | POA: Diagnosis not present

## 2015-02-13 DIAGNOSIS — M75121 Complete rotator cuff tear or rupture of right shoulder, not specified as traumatic: Secondary | ICD-10-CM | POA: Diagnosis not present

## 2015-02-13 HISTORY — PX: SHOULDER ARTHROSCOPY WITH SUBACROMIAL DECOMPRESSION: SHX5684

## 2015-02-13 HISTORY — PX: SHOULDER ARTHROSCOPY WITH OPEN ROTATOR CUFF REPAIR: SHX6092

## 2015-02-13 SURGERY — ARTHROSCOPY, SHOULDER WITH REPAIR, ROTATOR CUFF, OPEN
Anesthesia: Regional | Site: Shoulder | Laterality: Right

## 2015-02-13 MED ORDER — LACTATED RINGERS IV SOLN
INTRAVENOUS | Status: DC
  Administered 2015-02-13 (×2): via INTRAVENOUS

## 2015-02-13 MED ORDER — VANCOMYCIN HCL IN DEXTROSE 1-5 GM/200ML-% IV SOLN
INTRAVENOUS | Status: AC
  Filled 2015-02-13: qty 200

## 2015-02-13 MED ORDER — FENTANYL CITRATE (PF) 100 MCG/2ML IJ SOLN
INTRAMUSCULAR | Status: AC
  Filled 2015-02-13: qty 2

## 2015-02-13 MED ORDER — OXYCODONE HCL 5 MG PO TABS: 5.0000 mg | ORAL_TABLET | ORAL | Status: DC | PRN

## 2015-02-13 MED ORDER — SCOPOLAMINE 1 MG/3DAYS TD PT72: 1.0000 | MEDICATED_PATCH | Freq: Once | TRANSDERMAL | Status: DC | PRN

## 2015-02-13 MED ORDER — OXYCODONE HCL 5 MG PO TABS: 5.0000 mg | ORAL_TABLET | Freq: Once | ORAL | Status: DC | PRN

## 2015-02-13 MED ORDER — DEXAMETHASONE SODIUM PHOSPHATE 4 MG/ML IJ SOLN
INTRAMUSCULAR | Status: DC | PRN
  Administered 2015-02-13: 10 mg via INTRAVENOUS

## 2015-02-13 MED ORDER — MIDAZOLAM HCL 2 MG/2ML IJ SOLN
INTRAMUSCULAR | Status: AC
  Filled 2015-02-13: qty 4

## 2015-02-13 MED ORDER — MIDAZOLAM HCL 2 MG/2ML IJ SOLN
INTRAMUSCULAR | Status: AC
  Filled 2015-02-13: qty 2

## 2015-02-13 MED ORDER — MIDAZOLAM HCL 5 MG/5ML IJ SOLN
INTRAMUSCULAR | Status: DC | PRN
  Administered 2015-02-13: 2 mg via INTRAVENOUS

## 2015-02-13 MED ORDER — BUPIVACAINE-EPINEPHRINE (PF) 0.5% -1:200000 IJ SOLN
INTRAMUSCULAR | Status: DC | PRN
  Administered 2015-02-13: 25 mL via PERINEURAL

## 2015-02-13 MED ORDER — FENTANYL CITRATE (PF) 100 MCG/2ML IJ SOLN
INTRAMUSCULAR | Status: DC | PRN
  Administered 2015-02-13: 100 ug via INTRAVENOUS

## 2015-02-13 MED ORDER — EPHEDRINE SULFATE 50 MG/ML IJ SOLN
INTRAMUSCULAR | Status: DC | PRN
  Administered 2015-02-13: 10 mg via INTRAVENOUS

## 2015-02-13 MED ORDER — VANCOMYCIN HCL 1000 MG IV SOLR
1000.0000 mg | INTRAVENOUS | Status: DC | PRN
  Administered 2015-02-13: 1000 mg via INTRAVENOUS

## 2015-02-13 MED ORDER — LIDOCAINE HCL (CARDIAC) 20 MG/ML IV SOLN
INTRAVENOUS | Status: DC | PRN
  Administered 2015-02-13: 50 mg via INTRAVENOUS

## 2015-02-13 MED ORDER — VANCOMYCIN HCL IN DEXTROSE 1-5 GM/200ML-% IV SOLN
1000.0000 mg | INTRAVENOUS | Status: AC
  Administered 2015-02-13: 1000 mg via INTRAVENOUS

## 2015-02-13 MED ORDER — PROPOFOL 10 MG/ML IV BOLUS
INTRAVENOUS | Status: AC
  Filled 2015-02-13: qty 20

## 2015-02-13 MED ORDER — PROMETHAZINE HCL 25 MG/ML IJ SOLN: 6.2500 mg | INTRAMUSCULAR | Status: DC | PRN

## 2015-02-13 MED ORDER — MIDAZOLAM HCL 2 MG/2ML IJ SOLN
1.0000 mg | INTRAMUSCULAR | Status: DC | PRN
  Administered 2015-02-13: 2 mg via INTRAVENOUS

## 2015-02-13 MED ORDER — GLYCOPYRROLATE 0.2 MG/ML IJ SOLN: 0.2000 mg | Freq: Once | INTRAMUSCULAR | Status: DC | PRN

## 2015-02-13 MED ORDER — HYDROMORPHONE HCL 1 MG/ML IJ SOLN: 0.2500 mg | INTRAMUSCULAR | Status: DC | PRN

## 2015-02-13 MED ORDER — OXYCODONE HCL 5 MG/5ML PO SOLN: 5.0000 mg | Freq: Once | ORAL | Status: DC | PRN

## 2015-02-13 MED ORDER — DEXAMETHASONE SODIUM PHOSPHATE 10 MG/ML IJ SOLN
INTRAMUSCULAR | Status: AC
  Filled 2015-02-13: qty 1

## 2015-02-13 MED ORDER — SODIUM CHLORIDE 0.9 % IV SOLN: 75.0000 mL/h | INTRAVENOUS | Status: DC

## 2015-02-13 MED ORDER — GLYCOPYRROLATE 0.2 MG/ML IJ SOLN
INTRAMUSCULAR | Status: DC | PRN
  Administered 2015-02-13 (×2): 0.2 mg via INTRAVENOUS

## 2015-02-13 MED ORDER — FENTANYL CITRATE (PF) 100 MCG/2ML IJ SOLN
INTRAMUSCULAR | Status: AC
  Filled 2015-02-13: qty 4

## 2015-02-13 MED ORDER — SODIUM CHLORIDE 0.9 % IR SOLN
Status: DC | PRN
Start: 2015-02-13 — End: 2015-02-13
  Administered 2015-02-13: 6000 mL

## 2015-02-13 MED ORDER — FENTANYL CITRATE (PF) 100 MCG/2ML IJ SOLN
50.0000 ug | INTRAMUSCULAR | Status: DC | PRN
  Administered 2015-02-13: 100 ug via INTRAVENOUS

## 2015-02-13 MED ORDER — GLYCOPYRROLATE 0.2 MG/ML IJ SOLN
INTRAMUSCULAR | Status: AC
  Filled 2015-02-13: qty 1

## 2015-02-13 MED ORDER — ONDANSETRON HCL 4 MG/2ML IJ SOLN
INTRAMUSCULAR | Status: DC | PRN
  Administered 2015-02-13: 4 mg via INTRAVENOUS

## 2015-02-13 MED ORDER — SUCCINYLCHOLINE CHLORIDE 20 MG/ML IJ SOLN
INTRAMUSCULAR | Status: DC | PRN
  Administered 2015-02-13: 50 mg via INTRAVENOUS

## 2015-02-13 MED ORDER — LIDOCAINE HCL (CARDIAC) 20 MG/ML IV SOLN
INTRAVENOUS | Status: AC
  Filled 2015-02-13: qty 5

## 2015-02-13 MED ORDER — ONDANSETRON HCL 4 MG/2ML IJ SOLN
INTRAMUSCULAR | Status: AC
  Filled 2015-02-13: qty 2

## 2015-02-13 MED ORDER — PHENYLEPHRINE HCL 10 MG/ML IJ SOLN
INTRAMUSCULAR | Status: DC | PRN
  Administered 2015-02-13 (×3): 40 ug via INTRAVENOUS

## 2015-02-13 MED ORDER — CHLORHEXIDINE GLUCONATE 4 % EX LIQD: 60.0000 mL | Freq: Once | CUTANEOUS | Status: DC

## 2015-02-13 MED ORDER — PROPOFOL 10 MG/ML IV BOLUS
INTRAVENOUS | Status: DC | PRN
  Administered 2015-02-13: 120 mg via INTRAVENOUS

## 2015-02-13 SURGICAL SUPPLY — 85 items
ANCH SUT SHRT 12.5 CANN EYLT (Anchor) ×1 IMPLANT
ANCHOR PEEK ALL THREAD (Anchor) ×3 IMPLANT
ANCHOR SUT BIOCOMP LK 2.9X12.5 (Anchor) ×3 IMPLANT
APL SKNCLS STERI-STRIP NONHPOA (GAUZE/BANDAGES/DRESSINGS)
BAG DECANTER FOR FLEXI CONT (MISCELLANEOUS) IMPLANT
BENZOIN TINCTURE PRP APPL 2/3 (GAUZE/BANDAGES/DRESSINGS) IMPLANT
BLADE 4.2CUDA (BLADE) IMPLANT
BLADE AVERAGE 25MMX9MM (BLADE)
BLADE AVERAGE 25X9 (BLADE) IMPLANT
BLADE CUDA 5.5 (BLADE) IMPLANT
BLADE GREAT WHITE 4.2 (BLADE) IMPLANT
BLADE GREAT WHITE 4.2MM (BLADE)
BLADE SURG 15 STRL LF DISP TIS (BLADE) IMPLANT
BLADE SURG 15 STRL SS (BLADE)
BUR OVAL 6.0 (BURR) ×3 IMPLANT
CANNULA 5.75X71 LONG (CANNULA) IMPLANT
CANNULA ACUFLEX KIT 5X76 (CANNULA) ×3 IMPLANT
CANNULA TWIST IN 8.25X7CM (CANNULA) IMPLANT
CLEANER CAUTERY TIP 5X5 PAD (MISCELLANEOUS) IMPLANT
CLOSURE WOUND 1/2 X4 (GAUZE/BANDAGES/DRESSINGS)
CUTTER MENISCUS  4.2MM (BLADE)
CUTTER MENISCUS 4.2MM (BLADE) IMPLANT
DECANTER SPIKE VIAL GLASS SM (MISCELLANEOUS) IMPLANT
DERMASPAN .5-.9MM 4X4CM SHOU (Miscellaneous) ×3 IMPLANT
DRAPE SHOULDER BEACH CHAIR (DRAPES) ×3 IMPLANT
DRAPE SURG 17X23 STRL (DRAPES) ×3 IMPLANT
DRAPE U 20/CS (DRAPES) ×3 IMPLANT
DRAPE U-SHAPE 47X51 STRL (DRAPES) ×3 IMPLANT
DRSG EMULSION OIL 3X3 NADH (GAUZE/BANDAGES/DRESSINGS) ×6 IMPLANT
DRSG PAD ABDOMINAL 8X10 ST (GAUZE/BANDAGES/DRESSINGS) ×6 IMPLANT
DURAPREP 26ML APPLICATOR (WOUND CARE) ×3 IMPLANT
ELECT NEEDLE TIP 2.8 STRL (NEEDLE) ×3 IMPLANT
ELECT REM PT RETURN 9FT ADLT (ELECTROSURGICAL) ×3
ELECTRODE REM PT RTRN 9FT ADLT (ELECTROSURGICAL) ×1 IMPLANT
GAUZE SPONGE 4X4 12PLY STRL (GAUZE/BANDAGES/DRESSINGS) ×3 IMPLANT
GAUZE SPONGE 4X4 16PLY XRAY LF (GAUZE/BANDAGES/DRESSINGS) IMPLANT
GLOVE BIO SURGEON STRL SZ8.5 (GLOVE) ×3 IMPLANT
GLOVE BIOGEL PI IND STRL 7.5 (GLOVE) ×1 IMPLANT
GLOVE BIOGEL PI IND STRL 8 (GLOVE) ×1 IMPLANT
GLOVE BIOGEL PI IND STRL 8.5 (GLOVE) ×1 IMPLANT
GLOVE BIOGEL PI INDICATOR 7.5 (GLOVE) ×2
GLOVE BIOGEL PI INDICATOR 8 (GLOVE) ×2
GLOVE BIOGEL PI INDICATOR 8.5 (GLOVE) ×2
GLOVE ECLIPSE 8.0 STRL XLNG CF (GLOVE) ×6 IMPLANT
GLOVE SURG SS PI 7.5 STRL IVOR (GLOVE) ×3 IMPLANT
GOWN STRL REUS W/ TWL LRG LVL3 (GOWN DISPOSABLE) ×1 IMPLANT
GOWN STRL REUS W/ TWL XL LVL3 (GOWN DISPOSABLE) IMPLANT
GOWN STRL REUS W/TWL 2XL LVL3 (GOWN DISPOSABLE) ×3 IMPLANT
GOWN STRL REUS W/TWL LRG LVL3 (GOWN DISPOSABLE) ×3
GOWN STRL REUS W/TWL XL LVL3 (GOWN DISPOSABLE)
MANIFOLD NEPTUNE II (INSTRUMENTS) ×3 IMPLANT
NEEDLE 1/2 CIR CATGUT .05X1.09 (NEEDLE) IMPLANT
NEEDLE SCORPION MULTI FIRE (NEEDLE) IMPLANT
NS IRRIG 1000ML POUR BTL (IV SOLUTION) IMPLANT
PACK ARTHROSCOPY DSU (CUSTOM PROCEDURE TRAY) ×3 IMPLANT
PACK BASIN DAY SURGERY FS (CUSTOM PROCEDURE TRAY) ×3 IMPLANT
PAD CLEANER CAUTERY TIP 5X5 (MISCELLANEOUS)
PENCIL BUTTON HOLSTER BLD 10FT (ELECTRODE) ×3 IMPLANT
SET ARTHROSCOPY TUBING (MISCELLANEOUS) ×3
SET ARTHROSCOPY TUBING LN (MISCELLANEOUS) ×1 IMPLANT
SLEEVE SCD COMPRESS KNEE MED (MISCELLANEOUS) ×3 IMPLANT
SLING ARM IMMOBILIZER MED (SOFTGOODS) ×3 IMPLANT
SLING ARM LRG ADULT FOAM STRAP (SOFTGOODS) IMPLANT
SLING ARM MED ADULT FOAM STRAP (SOFTGOODS) IMPLANT
SPONGE LAP 4X18 X RAY DECT (DISPOSABLE) ×6 IMPLANT
STAPLER VISISTAT 35W (STAPLE) IMPLANT
STRIP CLOSURE SKIN 1/2X4 (GAUZE/BANDAGES/DRESSINGS) IMPLANT
SUCTION FRAZIER TIP 10 FR DISP (SUCTIONS) IMPLANT
SUT BONE WAX W31G (SUTURE) IMPLANT
SUT ETHIBOND 0 MO6 C/R (SUTURE) ×3 IMPLANT
SUT ETHIBOND 2-0 V-5 NEEDLE (SUTURE) IMPLANT
SUT ETHILON 3 0 PS 1 (SUTURE) IMPLANT
SUT ETHILON 4 0 PS 2 18 (SUTURE) IMPLANT
SUT MNCRL AB 3-0 PS2 18 (SUTURE) ×3 IMPLANT
SUT PROLENE 3 0 PS 2 (SUTURE) IMPLANT
SUT VIC AB 0 CT1 27 (SUTURE)
SUT VIC AB 0 CT1 27XBRD ANBCTR (SUTURE) IMPLANT
SUT VIC AB 0 SH 27 (SUTURE) ×3 IMPLANT
SUT VIC AB 2-0 SH 27 (SUTURE)
SUT VIC AB 2-0 SH 27XBRD (SUTURE) IMPLANT
SYR BULB 3OZ (MISCELLANEOUS) ×3 IMPLANT
TOWEL OR 17X24 6PK STRL BLUE (TOWEL DISPOSABLE) ×3 IMPLANT
WAND STAR VAC 90 (SURGICAL WAND) ×3 IMPLANT
WATER STERILE IRR 1000ML POUR (IV SOLUTION) ×3 IMPLANT
YANKAUER SUCT BULB TIP NO VENT (SUCTIONS) ×3 IMPLANT

## 2015-02-13 NOTE — Transfer of Care (Signed)
Immediate Anesthesia Transfer of Care Note  Patient: Cynthia [name]  Procedure(s) Performed: Procedure(s): SHOULDER ARTHROSCOPY WITH MINI-OPEN ROTATOR CUFF REPAIR, WITH DERMASPAN PATCH AND SUBACROMIAL DECOMPRESSION,  (Right) SHOULDER ARTHROSCOPY WITH SUBACROMIAL DECOMPRESSION (Right)  Patient Location: PACU  Anesthesia Type:GA combined with regional for post-op pain  Level of Consciousness: sedated  Airway & Oxygen Therapy: Patient Spontanous Breathing and Patient connected to face mask oxygen  Post-op Assessment: Report given to RN and Post -op Vital signs reviewed and stable  Post vital signs: Reviewed and stable  Last Vitals:  Filed Vitals:   02/13/15 0815  BP:   Pulse: 64  Temp:   Resp: 13    Complications: No apparent anesthesia complications

## 2015-02-13 NOTE — Op Note (Signed)
PATIENT ID:      Cynthia [name]  MRN:     [id number] DOB/AGE:    July 09, 1950 / 64 y.o.       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   Weight as of this encounter: 56.87 kg (125 lb 6 oz).     POSTOPERATIVE DIAGNOSIS:   SAME WITH PARTIAL TEAR BICEPS TENDON                                                                 Estimated body mass index is 23.7 kg/(m^2) as calculated from the following:   Height as of this encounter: 5\' 1"  (1.549 m).   Weight as of this encounter: 56.87 kg (125 lb 6 oz).     PROCEDURE:  Procedure(s):RIGHT SHOULDER ARTHROSCOPY WITH MINI-OPEN ROTATOR CUFF REPAIR, WITH [facility] PATCH AND SUBACROMIAL DECOMPRESSION, DEBRIDEMENT G-H JOINT     SURGEON:  Joni Fears, MD    ASSISTANT:   Biagio Borg, PA-C   (Present and scrubbed throughout the case, critical for assistance with exposure, retraction, instrumentation, and closure.)          ANESTHESIA: regional and general     DRAINS: none :      TOURNIQUET TIME: NONE USED   COMPLICATIONS:  None   CONDITION:  stable  PROCEDURE IN DETAIL: 250539   [name], Cynthia W 02/13/2015, 11:10 AM

## 2015-02-13 NOTE — Anesthesia Procedure Notes (Addendum)
Anesthesia Regional Block:  Interscalene brachial plexus block  Pre-Anesthetic Checklist: ,, timeout performed, Correct Patient, Correct Site, Correct Laterality, Correct Procedure, Correct Position, site marked, Risks and benefits discussed,  Surgical consent,  Pre-op evaluation,  At surgeon's request and post-op pain management  Laterality: Right  Prep: chloraprep       Needles:  Injection technique: Single-shot  Needle Type: Echogenic Stimulator Needle     Needle Length: 9cm 9 cm Needle Gauge: 21 and 21 G    Additional Needles:  Procedures: ultrasound guided (picture in chart) and nerve stimulator Interscalene brachial plexus block  Nerve Stimulator or Paresthesia:  Response: Deltoid, 0.5 mA,   Additional Responses:   Narrative:  Start time: 02/13/2015 8:05 AM End time: 02/13/2015 8:15 AM Injection made incrementally with aspirations every 5 mL.  Performed by: Personally  Anesthesiologist: Suzette Battiest  Additional Notes: Risks and benefits discussed. Pt tolerated well with no immediate complications.   Procedure Name: Intubation Date/Time: 02/13/2015 9:37 AM Performed by: Marrianne Mood Pre-anesthesia Checklist: Patient identified, Emergency Drugs available, Suction available, Patient being monitored and Timeout performed Patient Re-evaluated:Patient Re-evaluated prior to inductionOxygen Delivery Method: Circle System Utilized Preoxygenation: Pre-oxygenation with 100% oxygen Intubation Type: IV induction Ventilation: Mask ventilation without difficulty Laryngoscope Size: Miller and 3 Grade View: Grade III Tube type: Oral Tube size: 7.0 mm Number of attempts: 1 Airway Equipment and Method: Stylet and Oral airway Placement Confirmation: ETT inserted through vocal cords under direct vision,  positive ETCO2 and breath sounds checked- equal and bilateral Secured at: 22 cm Tube secured with: Tape Dental Injury: Teeth and Oropharynx as per pre-operative  assessment

## 2015-02-13 NOTE — Discharge Instructions (Signed)
° ° °  Regional Anesthesia Blocks ° °1. Numbness or the inability to move the "blocked" extremity may last from 3-48 hours after placement. The length of time depends on the medication injected and your individual response to the medication. If the numbness is not going away after 48 hours, call your surgeon. ° °2. The extremity that is blocked will need to be protected until the numbness is gone and the  Strength has returned. Because you cannot feel it, you will need to take extra care to avoid injury. Because it may be weak, you may have difficulty moving it or using it. You may not know what position it is in without looking at it while the block is in effect. ° °3. For blocks in the legs and feet, returning to weight bearing and walking needs to be done carefully. You will need to wait until the numbness is entirely gone and the strength has returned. You should be able to move your leg and foot normally before you try and bear weight or walk. You will need someone to be with you when you first try to ensure you do not fall and possibly risk injury. ° °4. Bruising and tenderness at the needle site are common side effects and will resolve in a few days. ° °5. Persistent numbness or new problems with movement should be communicated to the surgeon or the [facility] ([phone])/ [facility] ([phone]). ° ° ° °Post Anesthesia Home Care Instructions ° °Activity: °Get plenty of rest for the remainder of the day. A responsible adult should stay with you for 24 hours following the procedure.  °For the next 24 hours, DO NOT: °-Drive a car °-Operate machinery °-Drink alcoholic beverages °-Take any medication unless instructed by your physician °-Make any legal decisions or sign important papers. ° °Meals: °Start with liquid foods such as gelatin or soup. Progress to regular foods as tolerated. Avoid greasy, spicy, heavy foods. If nausea and/or vomiting occur, drink only clear liquids until  the nausea and/or vomiting subsides. Call your physician if vomiting continues. ° °Special Instructions/Symptoms: °Your throat may feel dry or sore from the anesthesia or the breathing tube placed in your throat during surgery. If this causes discomfort, gargle with warm salt water. The discomfort should disappear within 24 hours. ° °If you had a scopolamine patch placed behind your ear for the management of post- operative nausea and/or vomiting: ° °1. The medication in the patch is effective for 72 hours, after which it should be removed.  Wrap patch in a tissue and discard in the trash. Wash hands thoroughly with soap and water. °2. You may remove the patch earlier than 72 hours if you experience unpleasant side effects which may include dry mouth, dizziness or visual disturbances. °3. Avoid touching the patch. Wash your hands with soap and water after contact with the patch. °  ° °

## 2015-02-13 NOTE — Progress Notes (Signed)
Assisted Dr. Rodman Comp with right, ultrasound guided, interscalene  block. Side rails up, monitors on throughout procedure. See vital signs in flow sheet. Tolerated Procedure well.

## 2015-02-13 NOTE — Anesthesia Postprocedure Evaluation (Signed)
  Anesthesia Post-op Note  Patient: Cynthia [name]  Procedure(s) Performed: Procedure(s): SHOULDER ARTHROSCOPY WITH MINI-OPEN ROTATOR CUFF REPAIR, WITH DERMASPAN PATCH AND SUBACROMIAL DECOMPRESSION,  (Right) SHOULDER ARTHROSCOPY WITH SUBACROMIAL DECOMPRESSION (Right)  Patient Location: PACU  Anesthesia Type:GA combined with regional for post-op pain  Level of Consciousness: awake and alert   Airway and Oxygen Therapy: Patient Spontanous Breathing  Post-op Pain: none  Post-op Assessment: Post-op Vital signs reviewed              Post-op Vital Signs: Reviewed  Last Vitals:  Filed Vitals:   02/13/15 1230  BP: 134/86  Pulse: 65  Temp: 36.4 C  Resp: 20    Complications: No apparent anesthesia complications

## 2015-02-13 NOTE — H&P (Signed)
  The recent History & Physical has been reviewed. I have personally examined the patient today. There is no interval change to the documented History & Physical. The patient would like to proceed with the procedure.  Joni Fears W 02/13/2015,  8:56 AM

## 2015-02-13 NOTE — Anesthesia Preprocedure Evaluation (Addendum)
Anesthesia Evaluation  Patient identified by MRN, date of birth, ID band Patient awake    Reviewed: Allergy & Precautions, NPO status , Patient's Chart, lab work & pertinent test results  Airway Mallampati: II  TM Distance: >3 FB Neck ROM: Full    Dental  (+) Dental Advisory Given   Pulmonary former smoker,    breath sounds clear to auscultation       Cardiovascular negative cardio ROS   Rhythm:Regular Rate:Normal     Neuro/Psych negative neurological ROS     GI/Hepatic negative GI ROS, Neg liver ROS,   Endo/Other  negative endocrine ROS  Renal/GU negative Renal ROS     Musculoskeletal   Abdominal   Peds  Hematology negative hematology ROS (+)   Anesthesia Other Findings   Reproductive/Obstetrics                            Anesthesia Physical Anesthesia Plan  ASA: I  Anesthesia Plan: General and Regional   Post-op Pain Management: GA combined w/ Regional for post-op pain   Induction: Intravenous  Airway Management Planned: Oral ETT  Additional Equipment:   Intra-op Plan:   Post-operative Plan: Extubation in OR  Informed Consent: I have reviewed the patients History and Physical, chart, labs and discussed the procedure including the risks, benefits and alternatives for the proposed anesthesia with the patient or authorized representative who has indicated his/her understanding and acceptance.   Dental advisory given  Plan Discussed with: CRNA  Anesthesia Plan Comments:         Anesthesia Quick Evaluation

## 2015-02-14 ENCOUNTER — Encounter (HOSPITAL_BASED_OUTPATIENT_CLINIC_OR_DEPARTMENT_OTHER): Payer: Self-pay | Admitting: Orthopaedic Surgery

## 2015-02-14 NOTE — Op Note (Signed)
Cynthia [name], Cynthia [name]              ACCOUNT NO.:  [id number]  MEDICAL RECORD NO.:  [id number]  LOCATION:                               FACILITY:  [facility]  PHYSICIAN:  Vonna Kotyk. Whitfield, M.D.DATE OF BIRTH:  1950-06-30  DATE OF PROCEDURE:  02/13/2015 DATE OF DISCHARGE:  02/13/2015                              OPERATIVE REPORT   PREOPERATIVE DIAGNOSES: 1. Recurrent tear of rotator cuff, right shoulder with impingement. 2. Degenerative joint disease of glenohumeral joint.  POSTOPERATIVE DIAGNOSES: 1. Recurrent tear of rotator cuff, right shoulder with impingement. 2. Degenerative joint disease of glenohumeral joint. 3. Partial tear of biceps tendon.  PROCEDURE: 1. Diagnostic arthroscopy right shoulder with debridement of partial     tear biceps tendon, synovitis, and chondroplasty of the humeral     head. 2. Arthroscopic subacromial decompression. 3. Mini-open rotator cuff tear repair with supplemental Dermabond     patch.  SURGEON:  Vonna Kotyk. Durward Fortes, M.D.  ASSISTANT:  Aaron Edelman D. Petrarca, PA-C.  ANESTHESIA:  General with supplemental interscalene nerve block.  COMPLICATIONS:  None.  DESCRIPTION OF PROCEDURE:  [name] was met with the husband in the holding area identified her right shoulder as appropriate operative site and marked accordingly.  She did receive a preoperative interscalene nerve block per Anesthesia.  The patient was then transported to room #6 and placed under general orotracheal anesthesia without difficulty.  She was then placed in a semi-sitting position with the shoulder frame. Examination of the shoulder revealed no evidence of instability.  She had full overhead motion without evidence of adhesive capsulitis.  A time-out was called.  The right shoulder was then prepped from the base of the neck circumferentially below the elbow.  Sterile draping was performed.  A marking pen was used to outline the [facility] joint, the coracoid, and acromion.  At that  point, a fingerbreadth posterior and medial to the posterior angle acromion, a small stab wound was made while the scope easily placed in the shoulder joint.  Diagnostic arthroscopy revealed an obvious tear of the infra and supraspinatus tendons.  There was a moderate amount of beefy red synovitis.  There was considerable grade 3 changes of chondromalacia particularly in the humeral head where there were some areas of exposed subchondral bone with predominantly grade 3.  We did not see any loose bodies.  The labrum was somewhat frayed.  There was considerable fraying of the rotator cuff and I could visualize the subacromial space.  There was a partial tear of the biceps tendon, but I thought the majority of the tendon was intact and therefore just debridable, eft alone.  The second portal was established anteriorly with a small rib cannula. I debrided the synovitis.  The partial tear of the rotator cuff and any chondromalacia of the humeral head.  The arthroscope was then placed in subacromial space posteriorly, the cannula in the subacromial space anteriorly and a third portal established in the lateral subacromial space.  An arthroscopic subacromial decompression was performed.  There was a moderate amount of beefy red inflammatory bursal tissue, which was debrided with the ArthroCare wand and the Cuda shaver.  There was recurrent anterior impingement on the acromion and  this was debrided with a 6 mm bur. There had been a prior adequate resection of the distal clavicle.  I could easily visualize the rotator cuff tear from the bursal surface.  A mini open rotator cuff tear repair was then performed about an inch to inch and a quarter incision was made along the anterior aspect of the shoulder via sharp dissection carried down to subcutaneous tissue.  A rhaphe in the deltoid fascia was identified, incised and the fibers were separated with the Chung retractor.  Subacromial space was  entered. There were still some areas of bursitis that I resected.  The edges of the rotator cuff were identified.  They had been torn in a V fashion involving both the infra and supraspinatus tendons.  There appeared to be some atrophy.  I debrided the edges to get good bleeding tissue and then starting at the apex of V, repaired it from there anteriorly.  The biceps tendon was visualized.  I had good coverage of the biceps tendon. There was considerable delamination particularly of the infraspinatus and this was sutured in place.  Because of the recurrent nature and the atrophy and considerable tearing, I then applied a DermaSpan patch.  I did also use a Biomet 5.5 mm Peek anchor and the PushLock anchor to secure the cuff along the debrided humeral head.  I thought I had very nice repair without any tension.  This supplemental DermaSpan patch was soaked in saline solution for 10 minutes and then sutured under tension with 2-0 Ethibond suture, covering the torn portions nicely.  Then with a range of motion, it did not appear to be any tension or impingement of the repair or the dermis band patch.  The wound was copiously irrigated with saline solution.  The deltoid fascia closed with running 0 Vicryl, subcu with 3-0 Monocryl, skin closed with Steri-Strips over benzoin.  Sterile bulky dressing was applied followed by sling.  PLAN:  Oxycodone for pain.  Office, 1 week.     Vonna Kotyk. Durward Fortes, M.D.     PWW/MEDQ  D:  02/13/2015  T:  02/13/2015  Job:  [id number]

## 2015-02-24 ENCOUNTER — Ambulatory Visit: Payer: Medicare Other | Attending: Orthopaedic Surgery | Admitting: Physical Therapy

## 2015-02-24 DIAGNOSIS — M6281 Muscle weakness (generalized): Secondary | ICD-10-CM | POA: Insufficient documentation

## 2015-02-24 DIAGNOSIS — R6889 Other general symptoms and signs: Secondary | ICD-10-CM

## 2015-02-24 DIAGNOSIS — R600 Localized edema: Secondary | ICD-10-CM

## 2015-02-24 DIAGNOSIS — M25511 Pain in right shoulder: Secondary | ICD-10-CM

## 2015-02-24 DIAGNOSIS — R531 Weakness: Secondary | ICD-10-CM

## 2015-02-24 DIAGNOSIS — M25611 Stiffness of right shoulder, not elsewhere classified: Secondary | ICD-10-CM

## 2015-02-24 NOTE — Patient Instructions (Addendum)
ROM: Pendulum (Circular)  Let right arm move in circle clockwise, then counterclockwise, by rocking body weight in circular pattern. Circle _10___ times each direction per set. Do _3___ sessions per day.  Pendulum Side to Side  Bend forward 90 at waist, leaning on table for support. Rock body from side to side and let arm swing freely. Repeat _10___ times. Do __3__ sessions per day.  Finger Flexors  Keeping right fingertips straight, press putty toward base of palm. Repeat __20__ times. Do __3__ sessions per day. Activity: Squeeze flour sifter, plastic squeeze bottles, [place] baster, juice from fruit.*  AROM: Elbow Flexion / Extension  With left hand palm up, gently bend elbow as far as possible. Then straighten arm as far as possible. Repeat __10__ times per set. Do __3__ sessions per day.  SHOULDER: Flexion On Table  Place hands on table, elbows straight. Move hips away from body. Press hands down into table. Hold _3__ seconds. _10__ reps per set, __3_ sets per day. Posture - Sitting   Sit upright, head facing forward. Try using a roll to support lower back. Keep shoulders relaxed, and avoid rounded back. Keep hips level with knees. Avoid crossing legs for long periods. Sit on sit bones not tailbone.  Lift chest up /ribs  Up. Sit all the way back in the chair.  Avoid perching on edge of seat  Copyright  VHI. All rights reserved.   Cryotherapy  ICE 20 mins at most, 3 times a day following exercises.   Cryotherapy means treatment with cold. Ice or gel packs can be used to reduce both pain and swelling. Ice is the most helpful within the first 24 to 48 hours after an injury or flare-up from overusing a muscle or joint. Sprains, strains, spasms, burning pain, shooting pain, and aches can all be eased with ice. Ice can also be used when recovering from surgery. Ice is effective, has very few side effects, and is safe for most people to use. PRECAUTIONS  Ice is not a safe  treatment option for people with:  Raynaud phenomenon. This is a condition affecting small blood vessels in the extremities. Exposure to cold may cause your problems to return.  Cold hypersensitivity. There are many forms of cold hypersensitivity, including:  Cold urticaria. Red, itchy hives appear on the skin when the tissues begin to warm after being iced.  Cold erythema. This is a red, itchy rash caused by exposure to cold.  Cold hemoglobinuria. Red blood cells break down when the tissues begin to warm after being iced. The hemoglobin that carry oxygen are passed into the urine because they cannot combine with blood proteins fast enough.  Numbness or altered sensitivity in the area being iced. If you have any of the following conditions, do not use ice until you have discussed cryotherapy with your caregiver:  Heart conditions, such as arrhythmia, angina, or chronic heart disease.  High blood pressure.  Healing wounds or open skin in the area being iced.  Current infections.  Rheumatoid arthritis.  Poor circulation.  Diabetes. Ice slows the blood flow in the region it is applied. This is beneficial when trying to stop inflamed tissues from spreading irritating chemicals to surrounding tissues. However, if you expose your skin to cold temperatures for too long or without the proper protection, you can damage your skin or nerves. Watch for signs of skin damage due to cold. HOME CARE INSTRUCTIONS Follow these tips to use ice and cold packs safely.  Place a dry or  damp towel between the ice and skin. A damp towel will cool the skin more quickly, so you may need to shorten the time that the ice is used.  For a more rapid response, add gentle compression to the ice.  Ice for no more than 20 minutes at a time. The bonier the area you are icing, the less time it will take to get the benefits of ice.  Check your skin after 5 minutes to make sure there are no signs of a poor response  to cold or skin damage.  Rest 20 minutes or more between uses.  Once your skin is numb, you can end your treatment. You can test numbness by very lightly touching your skin. The touch should be so light that you do not see the skin dimple from the pressure of your fingertip. When using ice, most people will feel these normal sensations in this order: cold, burning, aching, and numbness.  Do not use ice on someone who cannot communicate their responses to pain, such as small children or people with dementia. HOW TO MAKE AN ICE PACK Ice packs are the most common way to use ice therapy. Other methods include ice massage, ice baths, and cryosprays. Muscle creams that cause a cold, tingly feeling do not offer the same benefits that ice offers and should not be used as a substitute unless recommended by your caregiver. To make an ice pack, do one of the following:  Place crushed ice or a bag of frozen vegetables in a sealable plastic bag. Squeeze out the excess air. Place this bag inside another plastic bag. Slide the bag into a pillowcase or place a damp towel between your skin and the bag.  Mix 3 parts water with 1 part rubbing alcohol. Freeze the mixture in a sealable plastic bag. When you remove the mixture from the freezer, it will be slushy. Squeeze out the excess air. Place this bag inside another plastic bag. Slide the bag into a pillowcase or place a damp towel between your skin and the bag. SEEK MEDICAL CARE IF:  You develop white spots on your skin. This may give the skin a blotchy (mottled) appearance.  Your skin turns blue or pale.  Your skin becomes waxy or hard.  Your swelling gets worse. MAKE SURE YOU:   Understand these instructions.  Will watch your condition.  Will get help right away if you are not doing well or get worse. Document Released: 01/04/2011 Document Revised: 09/24/2013 Document Reviewed: 01/04/2011 [facility] Patient Information 2015 [place], [place]. This  information is not intended to replace advice given to you by your health care provider. Make sure you discuss any questions you have with your health care provider.  Please use towel roll under arm pit and pillows as directed in clinic for pain free sleep.   Voncille Lo, PT 02/24/2015 2:06 PM Phone: [phone] Fax: [phone]
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
## 2015-03-03 ENCOUNTER — Ambulatory Visit: Payer: Medicare Other | Admitting: Physical Therapy

## 2015-03-03 DIAGNOSIS — R6889 Other general symptoms and signs: Secondary | ICD-10-CM | POA: Diagnosis not present

## 2015-03-03 DIAGNOSIS — M25511 Pain in right shoulder: Secondary | ICD-10-CM

## 2015-03-03 DIAGNOSIS — R531 Weakness: Secondary | ICD-10-CM

## 2015-03-03 DIAGNOSIS — M6281 Muscle weakness (generalized): Secondary | ICD-10-CM | POA: Diagnosis not present

## 2015-03-03 DIAGNOSIS — M25611 Stiffness of right shoulder, not elsewhere classified: Secondary | ICD-10-CM

## 2015-03-03 DIAGNOSIS — R600 Localized edema: Secondary | ICD-10-CM | POA: Diagnosis not present
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
## 2015-03-05 ENCOUNTER — Ambulatory Visit: Payer: Medicare Other | Admitting: Physical Therapy

## 2015-03-05 DIAGNOSIS — R6889 Other general symptoms and signs: Secondary | ICD-10-CM

## 2015-03-05 DIAGNOSIS — M25611 Stiffness of right shoulder, not elsewhere classified: Secondary | ICD-10-CM

## 2015-03-05 DIAGNOSIS — M6281 Muscle weakness (generalized): Secondary | ICD-10-CM | POA: Diagnosis not present

## 2015-03-05 DIAGNOSIS — M25511 Pain in right shoulder: Secondary | ICD-10-CM

## 2015-03-05 DIAGNOSIS — R600 Localized edema: Secondary | ICD-10-CM | POA: Diagnosis not present
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
## 2015-03-11 ENCOUNTER — Ambulatory Visit: Payer: Medicare Other | Admitting: Physical Therapy

## 2015-03-11 DIAGNOSIS — R6889 Other general symptoms and signs: Secondary | ICD-10-CM

## 2015-03-11 DIAGNOSIS — M25511 Pain in right shoulder: Secondary | ICD-10-CM | POA: Diagnosis not present

## 2015-03-11 DIAGNOSIS — R600 Localized edema: Secondary | ICD-10-CM | POA: Diagnosis not present

## 2015-03-11 DIAGNOSIS — M6281 Muscle weakness (generalized): Secondary | ICD-10-CM | POA: Diagnosis not present

## 2015-03-11 DIAGNOSIS — R531 Weakness: Secondary | ICD-10-CM

## 2015-03-11 DIAGNOSIS — M25611 Stiffness of right shoulder, not elsewhere classified: Secondary | ICD-10-CM
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
## 2015-03-13 ENCOUNTER — Ambulatory Visit: Payer: Medicare Other | Admitting: Physical Therapy

## 2015-03-13 DIAGNOSIS — R600 Localized edema: Secondary | ICD-10-CM | POA: Diagnosis not present

## 2015-03-13 DIAGNOSIS — M25511 Pain in right shoulder: Secondary | ICD-10-CM | POA: Diagnosis not present

## 2015-03-13 DIAGNOSIS — R6889 Other general symptoms and signs: Secondary | ICD-10-CM

## 2015-03-13 DIAGNOSIS — M25611 Stiffness of right shoulder, not elsewhere classified: Secondary | ICD-10-CM

## 2015-03-13 DIAGNOSIS — R531 Weakness: Secondary | ICD-10-CM

## 2015-03-13 DIAGNOSIS — M6281 Muscle weakness (generalized): Secondary | ICD-10-CM | POA: Diagnosis not present
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
## 2015-03-13 NOTE — Patient Instructions (Signed)
Scapular Retraction: Rowing (Eccentric) - Arms - 45 Degrees (Resistance Band)    Hold end of band in each hand. Pull back until elbows are even with trunk. Keep elbows out from sides at 45, thumbs up. Slowly release for 3-5 seconds. Use _red-green_______ resistance band.10 ___ reps per set,1-3 ___ sets per day, 5Static Strengthening    With right elbow bent and in at side, use other hand to resist OUTWARD motion of bent arm. Breathe normally throughout exercise. Hold _5___ seconds. Repeat _10___ times. Do1-2 ____ sessions per day. Issued from drawer standing.  Flexion, abd, IR/ER extension flexion all as above frequency and reps.  http://gt2.exer.us/68   Copyright  VHI. All rights reserved.  ___ days per week.   http://ecce.exer.us/229   Copyright  VHI. All rights reserved.

## 2015-03-18 ENCOUNTER — Ambulatory Visit: Payer: Medicare Other | Admitting: Physical Therapy

## 2015-03-18 DIAGNOSIS — M6281 Muscle weakness (generalized): Secondary | ICD-10-CM | POA: Diagnosis not present

## 2015-03-18 DIAGNOSIS — R600 Localized edema: Secondary | ICD-10-CM | POA: Diagnosis not present

## 2015-03-18 DIAGNOSIS — M25511 Pain in right shoulder: Secondary | ICD-10-CM | POA: Diagnosis not present

## 2015-03-18 DIAGNOSIS — R531 Weakness: Secondary | ICD-10-CM

## 2015-03-18 DIAGNOSIS — R6889 Other general symptoms and signs: Secondary | ICD-10-CM

## 2015-03-18 DIAGNOSIS — M25611 Stiffness of right shoulder, not elsewhere classified: Secondary | ICD-10-CM

## 2015-03-18 NOTE — Patient Instructions (Signed)
Exercise from drawer IR/ER bands,.[place] Kitchen
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
## 2015-03-20 ENCOUNTER — Ambulatory Visit: Payer: Medicare Other | Admitting: Physical Therapy

## 2015-03-20 DIAGNOSIS — M6281 Muscle weakness (generalized): Secondary | ICD-10-CM | POA: Diagnosis not present

## 2015-03-20 DIAGNOSIS — R600 Localized edema: Secondary | ICD-10-CM | POA: Diagnosis not present

## 2015-03-20 DIAGNOSIS — R6889 Other general symptoms and signs: Secondary | ICD-10-CM | POA: Diagnosis not present

## 2015-03-20 DIAGNOSIS — M25611 Stiffness of right shoulder, not elsewhere classified: Secondary | ICD-10-CM

## 2015-03-20 DIAGNOSIS — M25511 Pain in right shoulder: Secondary | ICD-10-CM

## 2015-03-20 DIAGNOSIS — R531 Weakness: Secondary | ICD-10-CM
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
## 2015-03-24 ENCOUNTER — Ambulatory Visit: Payer: Medicare Other | Admitting: Physical Therapy

## 2015-03-24 DIAGNOSIS — M25511 Pain in right shoulder: Secondary | ICD-10-CM | POA: Diagnosis not present

## 2015-03-24 DIAGNOSIS — R6889 Other general symptoms and signs: Secondary | ICD-10-CM | POA: Diagnosis not present

## 2015-03-24 DIAGNOSIS — R600 Localized edema: Secondary | ICD-10-CM | POA: Diagnosis not present

## 2015-03-24 DIAGNOSIS — R531 Weakness: Secondary | ICD-10-CM

## 2015-03-24 DIAGNOSIS — M19041 Primary osteoarthritis, right hand: Secondary | ICD-10-CM | POA: Diagnosis not present

## 2015-03-24 DIAGNOSIS — M79641 Pain in right hand: Secondary | ICD-10-CM | POA: Diagnosis not present

## 2015-03-24 DIAGNOSIS — M25611 Stiffness of right shoulder, not elsewhere classified: Secondary | ICD-10-CM

## 2015-03-24 DIAGNOSIS — M6281 Muscle weakness (generalized): Secondary | ICD-10-CM | POA: Diagnosis not present

## 2015-03-24 NOTE — Patient Instructions (Signed)
   PENNY  In Gym and everywhere. Point thumbs up and out with gym equipment and theraband  Over Head Pull: Narrow Grip       On back, knees bent, feet flat, band across thighs, elbows straight but relaxed. Pull hands apart (start). Keeping elbows straight, bring arms up and over head, hands toward floor. Keep pull steady on band. Hold momentarily. Return slowly, keeping pull steady, back to start. Repeat _10_ times. Band color ___yellow___   Side Pull: Double Arm   On back, knees bent, feet flat. Arms perpendicular to body, shoulder level, elbows straight but relaxed. Pull arms out to sides, elbows straight. Resistance band comes across collarbones, hands toward floor. Hold momentarily. Slowly return to starting position. Repeat 10___ times. Band color  yellow_____    Shoulder Rotation: Double Arm   On back, knees bent, feet flat, elbows tucked at sides, bent 90, hands palms up. Pull hands apart and down toward floor, keeping elbows near sides. Hold momentarily. Slowly return to starting position. Repeat _10__ times. Band color _yellow____  Cynthia [name], PT 03/24/2015 2:44 PM Phone: [phone] Fax: [phone]
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
## 2015-03-26 ENCOUNTER — Ambulatory Visit: Payer: Medicare Other | Admitting: Physical Therapy

## 2015-03-31 ENCOUNTER — Ambulatory Visit: Payer: Medicare Other | Attending: Orthopaedic Surgery | Admitting: Physical Therapy

## 2015-03-31 DIAGNOSIS — M25511 Pain in right shoulder: Secondary | ICD-10-CM | POA: Diagnosis not present

## 2015-03-31 DIAGNOSIS — M25611 Stiffness of right shoulder, not elsewhere classified: Secondary | ICD-10-CM

## 2015-03-31 DIAGNOSIS — R6889 Other general symptoms and signs: Secondary | ICD-10-CM | POA: Insufficient documentation

## 2015-03-31 NOTE — Patient Instructions (Signed)
Use ice, heat.  Hold exercises until you see MD .  Use sling if helpful.
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
## 2015-04-01 ENCOUNTER — Ambulatory Visit: Payer: Medicare Other | Admitting: Physical Therapy

## 2015-04-01 DIAGNOSIS — M79641 Pain in right hand: Secondary | ICD-10-CM | POA: Diagnosis not present

## 2015-04-01 DIAGNOSIS — M25511 Pain in right shoulder: Secondary | ICD-10-CM | POA: Diagnosis not present

## 2015-04-01 DIAGNOSIS — M19041 Primary osteoarthritis, right hand: Secondary | ICD-10-CM | POA: Diagnosis not present

## 2015-04-08 ENCOUNTER — Ambulatory Visit: Payer: Medicare Other | Admitting: Physical Therapy

## 2015-04-10 ENCOUNTER — Encounter: Payer: Medicare Other | Admitting: Physical Therapy

## 2015-04-10 ENCOUNTER — Telehealth: Payer: Self-pay | Admitting: Physical Therapy

## 2015-04-10 NOTE — Telephone Encounter (Signed)
Pt left message to follow up on care.  Pt had previously cancelled all appt after pt was sent back to MD for further evaluation.  Voncille Lo, PT 04/10/2015 1:25 PM Phone: [phone] Fax: [phone]

## 2015-04-14 ENCOUNTER — Encounter: Payer: Medicare Other | Admitting: Physical Therapy

## 2015-04-16 ENCOUNTER — Encounter: Payer: Medicare Other | Admitting: Physical Therapy

## 2015-04-21 ENCOUNTER — Encounter: Payer: Medicare Other | Admitting: Physical Therapy

## 2015-04-23 ENCOUNTER — Other Ambulatory Visit: Payer: Self-pay | Admitting: Orthopaedic Surgery

## 2015-04-23 DIAGNOSIS — M25511 Pain in right shoulder: Secondary | ICD-10-CM

## 2015-05-02 ENCOUNTER — Ambulatory Visit
Admission: RE | Admit: 2015-05-02 | Discharge: 2015-05-02 | Disposition: A | Discharge status: Home / Self Care | Payer: Medicare Other | Source: Ambulatory Visit | Attending: Orthopaedic Surgery | Admitting: Orthopaedic Surgery

## 2015-05-02 DIAGNOSIS — M25511 Pain in right shoulder: Secondary | ICD-10-CM

## 2015-05-02 DIAGNOSIS — S46011A Strain of muscle(s) and tendon(s) of the rotator cuff of right shoulder, initial encounter: Secondary | ICD-10-CM | POA: Diagnosis not present

## 2015-05-14 NOTE — Pre-Procedure Instructions (Signed)
Cynthia [name]  05/14/2015      [facility] # [place], [place] [address] [place] [place] [address] Phone: [phone] Fax: [phone]    Your procedure is scheduled on  Tuesday  05/20/15  Report to [facility] Admitting at 530 A.M.  Call this number if you have problems the morning of surgery:  [phone]   Remember:  Do not eat food or drink liquids after midnight.  Take these medicines the morning of surgery with A SIP OF WATER   keppra  (stop ASPIRIN, OMEGA 3 FISH OIL)   Do not wear jewelry, make-up or nail polish.  Do not wear lotions, powders, or perfumes.  You may wear deodorant.  Do not shave 48 hours prior to surgery.  Men may shave face and neck.  Do not bring valuables to the hospital.  [facility] is not responsible for any belongings or valuables.  Contacts, dentures or bridgework may not be worn into surgery.  Leave your suitcase in the car.  After surgery it may be brought to your room.  For patients admitted to the hospital, discharge time will be determined by your treatment team.  Patients discharged the day of surgery will not be allowed to drive home.   Name and phone number of your driver:    Special instructions:  [place] - Preparing for Surgery  Before surgery, you can play an important role.  Because skin is not sterile, your skin needs to be as free of germs as possible.  You can reduce the number of germs on you skin by washing with CHG (chlorahexidine gluconate) soap before surgery.  CHG is an antiseptic cleaner which kills germs and bonds with the skin to continue killing germs even after washing.  Please DO NOT use if you have an allergy to CHG or antibacterial soaps.  If your skin becomes reddened/irritated stop using the CHG and inform your nurse when you arrive at Short Stay.  Do not shave (including legs and underarms) for at least 48 hours prior to the first CHG shower.  You may  shave your face.  Please follow these instructions carefully:   1.  Shower with CHG Soap the night before surgery and the                                morning of Surgery.  2.  If you choose to wash your hair, wash your hair first as usual with your       normal shampoo.  3.  After you shampoo, rinse your hair and body thoroughly to remove the                      Shampoo.  4.  Use CHG as you would any other liquid soap.  You can apply chg directly       to the skin and wash gently with scrungie or a clean washcloth.  5.  Apply the CHG Soap to your body ONLY FROM THE NECK DOWN.        Do not use on open wounds or open sores.  Avoid contact with your eyes,       ears, mouth and genitals (private parts).  Wash genitals (private parts)       with your normal soap.  6.  Wash thoroughly, paying special attention to the  area where your surgery        will be performed.  7.  Thoroughly rinse your body with warm water from the neck down.  8.  DO NOT shower/wash with your normal soap after using and rinsing off       the CHG Soap.  9.  Pat yourself dry with a clean towel.            10.  Wear clean pajamas.            11.  Place clean sheets on your bed the night of your first shower and do not        sleep with pets.  Day of Surgery  Do not apply any lotions/deoderants the morning of surgery.  Please wear clean clothes to the hospital/surgery center.    Please read over the following fact sheets that you were given. Pain Booklet, Coughing and Deep Breathing and Surgical Site Infection Prevention

## 2015-05-15 ENCOUNTER — Encounter (HOSPITAL_COMMUNITY): Payer: Self-pay

## 2015-05-15 ENCOUNTER — Encounter (HOSPITAL_COMMUNITY)
Admission: RE | Admit: 2015-05-15 | Discharge: 2015-05-15 | Disposition: A | Discharge status: Home / Self Care | Payer: Medicare Other | Source: Ambulatory Visit | Attending: Orthopaedic Surgery | Admitting: Orthopaedic Surgery

## 2015-05-15 DIAGNOSIS — M75101 Unspecified rotator cuff tear or rupture of right shoulder, not specified as traumatic: Secondary | ICD-10-CM | POA: Diagnosis not present

## 2015-05-15 DIAGNOSIS — M19011 Primary osteoarthritis, right shoulder: Secondary | ICD-10-CM | POA: Diagnosis not present

## 2015-05-15 DIAGNOSIS — Z79899 Other long term (current) drug therapy: Secondary | ICD-10-CM | POA: Insufficient documentation

## 2015-05-15 DIAGNOSIS — M7541 Impingement syndrome of right shoulder: Secondary | ICD-10-CM | POA: Diagnosis not present

## 2015-05-15 DIAGNOSIS — Z01812 Encounter for preprocedural laboratory examination: Secondary | ICD-10-CM | POA: Diagnosis not present

## 2015-05-15 HISTORY — DX: Childhood onset fluency disorder: F80.81

## 2015-05-15 HISTORY — DX: Unspecified asthma, uncomplicated: J45.909

## 2015-05-15 LAB — BASIC METABOLIC PANEL
Anion gap: 7 (ref 5–15)
BUN: 21 mg/dL — AB (ref 6–20)
CHLORIDE: 105 mmol/L (ref 101–111)
CO2: 30 mmol/L (ref 22–32)
CREATININE: 0.74 mg/dL (ref 0.44–1.00)
Calcium: 9.9 mg/dL (ref 8.9–10.3)
GFR calc Af Amer: >60 mL/min (ref >60)
GFR calc non Af Amer: >60 mL/min (ref >60)
GLUCOSE: 95 mg/dL (ref 65–99)
POTASSIUM: 4.5 mmol/L (ref 3.5–5.1)
Sodium: 142 mmol/L (ref 135–145)

## 2015-05-15 LAB — CBC
HCT: 38.8 % (ref 36.0–46.0)
Hemoglobin: 13.1 g/dL (ref 12.0–15.0)
MCH: 32 pg (ref 26.0–34.0)
MCHC: 33.8 g/dL (ref 30.0–36.0)
MCV: 94.6 fL (ref 78.0–100.0)
PLATELETS: 228 10*3/uL (ref 150–400)
RBC: 4.1 MIL/uL (ref 3.87–5.11)
RDW: 13.2 % (ref 11.5–15.5)
WBC: 5.2 10*3/uL (ref 4.0–10.5)

## 2015-05-15 NOTE — Pre-Procedure Instructions (Signed)
SAVANNAH MEIR  05/15/2015      [facility] # [place], [place] [address] Phone: [phone] Fax: [phone]    Your procedure is scheduled on  Tuesday  05/20/15  Report to [facility] Admitting at 530 A.M.  Call this number if you have problems the morning of surgery:  [phone]   Remember:  Do not eat food or drink liquids after midnight.  Take these medicines the morning of surgery with A SIP OF WATER   keppra  (stop ASPIRIN, OMEGA 3 FISH OIL)   Do not wear jewelry, make-up or nail polish.  Do not wear lotions, powders, or perfumes.  You may wear deodorant.  Do not shave 48 hours prior to surgery.  Men may shave face and neck.  Do not bring valuables to the hospital.  [facility] is not responsible for any belongings or valuables.  Contacts, dentures or bridgework may not be worn into surgery.  Leave your suitcase in the car.  After surgery it may be brought to your room.  For patients admitted to the hospital, discharge time will be determined by your treatment team.  Patients discharged the day of surgery will not be allowed to drive home.   Name and phone number of your driver:    Special instructions:  [place] - Preparing for Surgery  Before surgery, you can play an important role.  Because skin is not sterile, your skin needs to be as free of germs as possible.  You can reduce the number of germs on you skin by washing with CHG (chlorahexidine gluconate) soap before surgery.  CHG is an antiseptic cleaner which kills germs and bonds with the skin to continue killing germs even after washing.  Please DO NOT use if you have an allergy to CHG or antibacterial soaps.  If your skin becomes reddened/irritated stop using the CHG and inform your nurse when you arrive at Short Stay.  Do not shave (including legs and underarms) for at least 48 hours prior to the first CHG shower.  You may  shave your face.  Please follow these instructions carefully:   1.  Shower with CHG Soap the night before surgery and the                                morning of Surgery.  2.  If you choose to wash your hair, wash your hair first as usual with your       normal shampoo.  3.  After you shampoo, rinse your hair and body thoroughly to remove the                      Shampoo.  4.  Use CHG as you would any other liquid soap.  You can apply chg directly       to the skin and wash gently with scrungie or a clean washcloth.  5.  Apply the CHG Soap to your body ONLY FROM THE NECK DOWN.        Do not use on open wounds or open sores.  Avoid contact with your eyes,       ears, mouth and genitals (private parts).  Wash genitals (private parts)       with your normal soap.  6.  Wash thoroughly, paying special attention to the  area where your surgery        will be performed.  7.  Thoroughly rinse your body with warm water from the neck down.  8.  DO NOT shower/wash with your normal soap after using and rinsing off       the CHG Soap.  9.  Pat yourself dry with a clean towel.            10.  Wear clean pajamas.            11.  Place clean sheets on your bed the night of your first shower and do not        sleep with pets.  Day of Surgery  Do not apply any lotions/deoderants the morning of surgery.  Please wear clean clothes to the hospital/surgery center.    Please read over the following fact sheets that you were given. Pain Booklet, Coughing and Deep Breathing and Surgical Site Infection Prevention
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
## 2015-05-19 MED ORDER — CHLORHEXIDINE GLUCONATE 4 % EX LIQD: 60.0000 mL | Freq: Once | CUTANEOUS | Status: DC

## 2015-05-19 MED ORDER — SODIUM CHLORIDE 0.9 % IV SOLN: 75.0000 mL/h | INTRAVENOUS | Status: DC

## 2015-05-19 MED ORDER — ACETAMINOPHEN 10 MG/ML IV SOLN: 1000.0000 mg | INTRAVENOUS | Status: DC

## 2015-05-19 MED ORDER — CEFAZOLIN SODIUM-DEXTROSE 2-3 GM-% IV SOLR
2.0000 g | INTRAVENOUS | Status: AC
  Administered 2015-05-20: 2 g via INTRAVENOUS
  Filled 2015-05-19: qty 50

## 2015-05-19 NOTE — Anesthesia Preprocedure Evaluation (Addendum)
Anesthesia Evaluation  Patient identified by MRN, date of birth, ID band Patient awake    Reviewed: Allergy & Precautions, H&P , NPO status , Patient's Chart, lab work & pertinent test results  History of Anesthesia Complications Negative for: history of anesthetic complications  Airway Mallampati: II  TM Distance: >3 FB Neck ROM: Full    Dental  (+) Dental Advidsory Given, Teeth Intact   Pulmonary asthma , former smoker,    breath sounds clear to auscultation       Cardiovascular negative cardio ROS   Rhythm:regular Rate:Normal     Neuro/Psych  Headaches, negative neurological ROS  negative psych ROS   GI/Hepatic negative GI ROS, Neg liver ROS,   Endo/Other  negative endocrine ROS  Renal/GU negative Renal ROS     Musculoskeletal   Abdominal   Peds  Hematology negative hematology ROS (+)   Anesthesia Other Findings   Reproductive/Obstetrics                           Anesthesia Physical  Anesthesia Plan  ASA: II  Anesthesia Plan: General and Regional   Post-op Pain Management: GA combined w/ Regional for post-op pain   Induction: Intravenous  Airway Management Planned: Oral ETT  Additional Equipment:   Intra-op Plan:   Post-operative Plan: Extubation in OR  Informed Consent: I have reviewed the patients History and Physical, chart, labs and discussed the procedure including the risks, benefits and alternatives for the proposed anesthesia with the patient or authorized representative who has indicated his/her understanding and acceptance.   Dental Advisory Given  Plan Discussed with: CRNA, Anesthesiologist and Surgeon  Anesthesia Plan Comments:       Anesthesia Quick Evaluation

## 2015-05-20 ENCOUNTER — Ambulatory Visit (HOSPITAL_COMMUNITY): Payer: Medicare Other | Admitting: Anesthesiology

## 2015-05-20 ENCOUNTER — Encounter (HOSPITAL_COMMUNITY): Payer: Self-pay | Admitting: Surgery

## 2015-05-20 ENCOUNTER — Ambulatory Visit (HOSPITAL_COMMUNITY)
Admission: RE | Admit: 2015-05-20 | Discharge: 2015-05-20 | Disposition: A | Discharge status: Home / Self Care | Payer: Medicare Other | Source: Ambulatory Visit | Attending: Orthopaedic Surgery | Admitting: Orthopaedic Surgery

## 2015-05-20 ENCOUNTER — Encounter (HOSPITAL_COMMUNITY)
Admission: RE | Disposition: A | Discharge status: Home / Self Care | Payer: Self-pay | Source: Ambulatory Visit | Attending: Orthopaedic Surgery

## 2015-05-20 DIAGNOSIS — X500XXA Overexertion from strenuous movement or load, initial encounter: Secondary | ICD-10-CM | POA: Diagnosis not present

## 2015-05-20 DIAGNOSIS — S46011A Strain of muscle(s) and tendon(s) of the rotator cuff of right shoulder, initial encounter: Secondary | ICD-10-CM | POA: Insufficient documentation

## 2015-05-20 DIAGNOSIS — M8580 Other specified disorders of bone density and structure, unspecified site: Secondary | ICD-10-CM | POA: Diagnosis not present

## 2015-05-20 DIAGNOSIS — M75101 Unspecified rotator cuff tear or rupture of right shoulder, not specified as traumatic: Secondary | ICD-10-CM | POA: Diagnosis not present

## 2015-05-20 DIAGNOSIS — M75120 Complete rotator cuff tear or rupture of unspecified shoulder, not specified as traumatic: Secondary | ICD-10-CM

## 2015-05-20 DIAGNOSIS — M75121 Complete rotator cuff tear or rupture of right shoulder, not specified as traumatic: Secondary | ICD-10-CM | POA: Diagnosis not present

## 2015-05-20 DIAGNOSIS — Z7982 Long term (current) use of aspirin: Secondary | ICD-10-CM | POA: Diagnosis not present

## 2015-05-20 DIAGNOSIS — Z87891 Personal history of nicotine dependence: Secondary | ICD-10-CM | POA: Diagnosis not present

## 2015-05-20 DIAGNOSIS — Z79899 Other long term (current) drug therapy: Secondary | ICD-10-CM | POA: Diagnosis not present

## 2015-05-20 DIAGNOSIS — G8918 Other acute postprocedural pain: Secondary | ICD-10-CM | POA: Diagnosis not present

## 2015-05-20 HISTORY — PX: SHOULDER OPEN ROTATOR CUFF REPAIR: SHX2407

## 2015-05-20 SURGERY — REPAIR, ROTATOR CUFF, OPEN
Anesthesia: Regional | Site: Shoulder | Laterality: Right

## 2015-05-20 MED ORDER — 0.9 % SODIUM CHLORIDE (POUR BTL) OPTIME
TOPICAL | Status: DC | PRN
  Administered 2015-05-20: 1000 mL

## 2015-05-20 MED ORDER — SUGAMMADEX SODIUM 200 MG/2ML IV SOLN
INTRAVENOUS | Status: AC
  Filled 2015-05-20: qty 2

## 2015-05-20 MED ORDER — SODIUM CHLORIDE 0.9 % IV SOLN
10.0000 mg | INTRAVENOUS | Status: DC | PRN
  Administered 2015-05-20: 50 ug/min via INTRAVENOUS

## 2015-05-20 MED ORDER — PROMETHAZINE HCL 25 MG/ML IJ SOLN: 6.2500 mg | INTRAMUSCULAR | Status: DC | PRN

## 2015-05-20 MED ORDER — BUPIVACAINE-EPINEPHRINE (PF) 0.5% -1:200000 IJ SOLN
INTRAMUSCULAR | Status: DC | PRN
  Administered 2015-05-20: 150 mL via PERINEURAL

## 2015-05-20 MED ORDER — ROCURONIUM BROMIDE 100 MG/10ML IV SOLN
INTRAVENOUS | Status: DC | PRN
  Administered 2015-05-20: 50 mg via INTRAVENOUS

## 2015-05-20 MED ORDER — SUGAMMADEX SODIUM 200 MG/2ML IV SOLN
INTRAVENOUS | Status: DC | PRN
  Administered 2015-05-20: 120.6 mg via INTRAVENOUS

## 2015-05-20 MED ORDER — MIDAZOLAM HCL 5 MG/5ML IJ SOLN
INTRAMUSCULAR | Status: DC | PRN
Start: 2015-05-20 — End: 2015-05-20
  Administered 2015-05-20: 2 mg via INTRAVENOUS

## 2015-05-20 MED ORDER — HYDROMORPHONE HCL 2 MG PO TABS: 2.0000 mg | ORAL_TABLET | ORAL | Status: DC | PRN

## 2015-05-20 MED ORDER — EPHEDRINE SULFATE 50 MG/ML IJ SOLN
INTRAMUSCULAR | Status: DC | PRN
  Administered 2015-05-20 (×2): 5 mg via INTRAVENOUS

## 2015-05-20 MED ORDER — BUPIVACAINE-EPINEPHRINE (PF) 0.5% -1:200000 IJ SOLN
INTRAMUSCULAR | Status: AC
  Filled 2015-05-20: qty 30

## 2015-05-20 MED ORDER — HYDROMORPHONE HCL 1 MG/ML IJ SOLN: 0.2500 mg | INTRAMUSCULAR | Status: DC | PRN

## 2015-05-20 MED ORDER — DEXAMETHASONE SODIUM PHOSPHATE 10 MG/ML IJ SOLN
INTRAMUSCULAR | Status: DC | PRN
  Administered 2015-05-20: 10 mg via INTRAVENOUS

## 2015-05-20 MED ORDER — ARTIFICIAL TEARS OP OINT
TOPICAL_OINTMENT | OPHTHALMIC | Status: DC | PRN
  Administered 2015-05-20: 1 via OPHTHALMIC

## 2015-05-20 MED ORDER — FENTANYL CITRATE (PF) 100 MCG/2ML IJ SOLN
INTRAMUSCULAR | Status: DC | PRN
  Administered 2015-05-20 (×2): 50 ug via INTRAVENOUS

## 2015-05-20 MED ORDER — PROPOFOL 10 MG/ML IV BOLUS
INTRAVENOUS | Status: AC
  Filled 2015-05-20: qty 20

## 2015-05-20 MED ORDER — MIDAZOLAM HCL 2 MG/2ML IJ SOLN
INTRAMUSCULAR | Status: AC
  Filled 2015-05-20: qty 2

## 2015-05-20 MED ORDER — ONDANSETRON HCL 4 MG/2ML IJ SOLN
INTRAMUSCULAR | Status: DC | PRN
  Administered 2015-05-20: 4 mg via INTRAVENOUS

## 2015-05-20 MED ORDER — PROPOFOL 10 MG/ML IV BOLUS
INTRAVENOUS | Status: DC | PRN
  Administered 2015-05-20: 200 mg via INTRAVENOUS

## 2015-05-20 MED ORDER — FENTANYL CITRATE (PF) 250 MCG/5ML IJ SOLN
INTRAMUSCULAR | Status: AC
  Filled 2015-05-20: qty 5

## 2015-05-20 MED ORDER — LACTATED RINGERS IV SOLN
INTRAVENOUS | Status: DC | PRN
  Administered 2015-05-20 (×2): via INTRAVENOUS

## 2015-05-20 MED ORDER — SCOPOLAMINE 1 MG/3DAYS TD PT72
1.0000 | MEDICATED_PATCH | TRANSDERMAL | Status: DC
  Administered 2015-05-20: 1.5 mg via TRANSDERMAL
  Filled 2015-05-20: qty 1

## 2015-05-20 MED ORDER — ONDANSETRON 4 MG PO TBDP: 4.0000 mg | ORAL_TABLET | Freq: Three times a day (TID) | ORAL | Status: DC | PRN

## 2015-05-20 SURGICAL SUPPLY — 55 items
ANCH SUT 2 CP-2 ROTR CUF (Anchor) ×2 IMPLANT
ANCHOR ROTATOR CUFF #2 (Anchor) ×6 IMPLANT
APL SKNCLS STERI-STRIP NONHPOA (GAUZE/BANDAGES/DRESSINGS) ×1
BENZOIN TINCTURE PRP APPL 2/3 (GAUZE/BANDAGES/DRESSINGS) ×3 IMPLANT
BIT DRILL TAK (DRILL) IMPLANT
CLEANER TIP ELECTROSURG 2X2 (MISCELLANEOUS) ×3 IMPLANT
CLOSURE WOUND 1/2 X4 (GAUZE/BANDAGES/DRESSINGS) ×1
COVER SURGICAL LIGHT HANDLE (MISCELLANEOUS) ×3 IMPLANT
DERMASPAN .5-.9MM 4X4CM SHOU (Miscellaneous) ×3 IMPLANT
DRAPE IMP U-DRAPE 54X76 (DRAPES) ×3 IMPLANT
DRAPE INCISE IOBAN 66X45 STRL (DRAPES) ×3 IMPLANT
DRILL TAK (DRILL)
DRSG EMULSION OIL 3X3 NADH (GAUZE/BANDAGES/DRESSINGS) ×3 IMPLANT
DRSG PAD ABDOMINAL 8X10 ST (GAUZE/BANDAGES/DRESSINGS) ×6 IMPLANT
DURAPREP 26ML APPLICATOR (WOUND CARE) ×3 IMPLANT
ELECT NEEDLE TIP 2.8 STRL (NEEDLE) ×3 IMPLANT
ELECT REM PT RETURN 9FT ADLT (ELECTROSURGICAL) ×3
ELECTRODE REM PT RTRN 9FT ADLT (ELECTROSURGICAL) ×1 IMPLANT
FACESHIELD WRAPAROUND (MASK) ×6 IMPLANT
GAUZE SPONGE 4X4 12PLY STRL (GAUZE/BANDAGES/DRESSINGS) ×3 IMPLANT
GLOVE BIOGEL PI IND STRL 8 (GLOVE) ×1 IMPLANT
GLOVE BIOGEL PI INDICATOR 8 (GLOVE) ×2
GLOVE ECLIPSE 8.0 STRL XLNG CF (GLOVE) ×3 IMPLANT
GOWN STRL REUS W/ TWL LRG LVL3 (GOWN DISPOSABLE) ×3 IMPLANT
GOWN STRL REUS W/TWL LRG LVL3 (GOWN DISPOSABLE) ×9
KIT BASIN OR (CUSTOM PROCEDURE TRAY) ×3 IMPLANT
KIT ROOM TURNOVER OR (KITS) ×3 IMPLANT
MANIFOLD NEPTUNE II (INSTRUMENTS) ×3 IMPLANT
NEEDLE HYPO 25GX1X1/2 BEV (NEEDLE) ×3 IMPLANT
NS IRRIG 1000ML POUR BTL (IV SOLUTION) ×3 IMPLANT
PACK SHOULDER (CUSTOM PROCEDURE TRAY) ×3 IMPLANT
PACK UNIVERSAL I (CUSTOM PROCEDURE TRAY) ×3 IMPLANT
PAD ABD 8X10 STRL (GAUZE/BANDAGES/DRESSINGS) ×3 IMPLANT
PAD ARMBOARD 7.5X6 YLW CONV (MISCELLANEOUS) ×6 IMPLANT
SLING ARM IMMOBILIZER MED (SOFTGOODS) ×3 IMPLANT
SPEAR FASTAKII (SLEEVE) IMPLANT
SPONGE GAUZE 4X4 12PLY STER LF (GAUZE/BANDAGES/DRESSINGS) ×3 IMPLANT
SPONGE LAP 4X18 X RAY DECT (DISPOSABLE) ×6 IMPLANT
STAPLER VISISTAT 35W (STAPLE) ×3 IMPLANT
STRIP CLOSURE SKIN 1/2X4 (GAUZE/BANDAGES/DRESSINGS) ×2 IMPLANT
SUCTION FRAZIER TIP 10 FR DISP (SUCTIONS) ×3 IMPLANT
SUT ETHIBOND 2 0 SH (SUTURE) ×3 IMPLANT
SUT ETHIBOND 2 OS 4 DA (SUTURE) ×12 IMPLANT
SUT ETHIBOND CT1 BRD #0 30IN (SUTURE) ×3 IMPLANT
SUT ETHIBOND X763 2 0 SH 1 (SUTURE) ×3 IMPLANT
SUT VIC AB 0 CT1 27 (SUTURE) ×6
SUT VIC AB 0 CT1 27XBRD ANBCTR (SUTURE) ×2 IMPLANT
SUT VIC AB 2-0 CT1 27 (SUTURE) ×3
SUT VIC AB 2-0 CT1 TAPERPNT 27 (SUTURE) ×1 IMPLANT
SYR CONTROL 10ML LL (SYRINGE) ×3 IMPLANT
TAPE CLOTH SURG 6X10 WHT LF (GAUZE/BANDAGES/DRESSINGS) ×3 IMPLANT
TOWEL OR 17X24 6PK STRL BLUE (TOWEL DISPOSABLE) ×3 IMPLANT
TOWEL OR 17X26 10 PK STRL BLUE (TOWEL DISPOSABLE) ×3 IMPLANT
WATER STERILE IRR 1000ML POUR (IV SOLUTION) ×3 IMPLANT
YANKAUER SUCT BULB TIP NO VENT (SUCTIONS) IMPLANT

## 2015-05-20 NOTE — Op Note (Signed)
PATIENT ID:      KENDYLE PERIN  MRN:     [id number] DOB/AGE:    [birth year] / 64 y.o.       OPERATIVE REPORT    DATE OF PROCEDURE:  05/20/2015       PREOPERATIVE DIAGNOSIS:  RETEAR RIGHT ROTATOR CUFF                                                       Estimated body mass index is 25.14 kg/(m^2) as calculated from the following:   Height as of 05/15/15: 5\' 1"  (1.549 m).   Weight as of this encounter: 60.328 kg (133 lb).     POSTOPERATIVE DIAGNOSIS:   RIGHT TORN ROTATOR CUFF -SAME                                                                    Estimated body mass index is 25.14 kg/(m^2) as calculated from the following:   Height as of 05/15/15: 5\' 1"  (1.549 m).   Weight as of this encounter: 60.328 kg (133 lb).     PROCEDURE:  Procedure(s): RE-REPAIR RIGHT ROTATOR CUFF WITH DERMASPAN PATCH.      SURGEON:  Joni Fears, MD    ASSISTANT:   Biagio Borg, PA-C   (Present and scrubbed throughout the case, critical for assistance with exposure, retraction, instrumentation, and closure.)          ANESTHESIA: regional and general     DRAINS: none :      TOURNIQUET TIME: * No tourniquets in log *    COMPLICATIONS:  None   CONDITION:  stable  PROCEDURE IN DETAIL:    Tammie Ellsworth W 05/20/2015, 9:24 AM

## 2015-05-20 NOTE — Transfer of Care (Signed)
Immediate Anesthesia Transfer of Care Note  Patient: Cynthia [name]  Procedure(s) Performed: Procedure(s) with comments: RE-REPAIR RIGHT ROTATOR CUFF WITH DERMASPAN PATCH. (Right) - RE-REPAIR RIGHT ROTATOR CUFF WITH POSSIBLE DERMASPAN PATCH.  Patient Location: PACU  Anesthesia Type:General  Level of Consciousness: awake, alert  and oriented  Airway & Oxygen Therapy: Patient Spontanous Breathing and Patient connected to nasal cannula oxygen  Post-op Assessment: Report given to RN, Post -op Vital signs reviewed and stable and Patient moving all extremities X 4  Post vital signs: Reviewed and stable  Last Vitals:  Filed Vitals:   05/20/15 0544  BP: 164/78  Pulse: 66  Temp: 36.6 C  Resp: 20    Complications: No apparent anesthesia complications

## 2015-05-20 NOTE — Op Note (Signed)
NAMEBRITTAN, BUTTERBAUGH              ACCOUNT NO.:  [id number]  MEDICAL RECORD NO.:  [id number]  LOCATION:  [facility]                         FACILITY:  [facility]  PHYSICIAN:  Vonna Kotyk. Lissett Favorite, M.D.DATE OF BIRTH:  Feb 19, 1951  DATE OF PROCEDURE:  05/20/2015 DATE OF DISCHARGE:  05/20/2015                              OPERATIVE REPORT   PREOPERATIVE DIAGNOSIS:  Re-tear of right rotator cuff.  POSTOPERATIVE DIAGNOSIS:  Re-tear of right rotator cuff.  PROCEDURE:  Exploration of right rotator cuff, repair, and supplemental application of DermaSpan patch.  SURGEON:  Vonna Kotyk. Durward Fortes, M.D.  ASSISTANT:  Biagio Borg, PA-C.  ANESTHESIA:  General, with supplemental interscalene nerve block.  COMPLICATIONS:  None.  HISTORY:  Mrs. Preece is just over 3 months post rotator cuff tear repair of a complicated tear of the infra and supraspinatus.  She has had a prior operation many years ago and this represented a re-tear.  I supplemented the repair with a DermaSpan patch.  She did very well postoperatively, but did feel onset of pain associated with inability to raise her head after lifting a 20-pound bag of kitty litter.  Since that time, she has had persistent pain, difficulty with overhead activity. She has had a repeat MRI scan revealing a tear of the infra and supraspinatus as well as probable subscapular.  She had had a partial tear of the biceps tendon, was representing less than 50% of its width, which was debrided and it appears that she has ruptured that as well. She also has some degenerative changes and she is now to have a re- exploration, hope to repair and then reapply DermaSpan patch.  DESCRIPTION OF PROCEDURE:  Ms. Valvano was met in the holding area, identified the right shoulder's appropriate operative site and marked it accordingly.  She did receive a preoperative interscalene nerve block. The patient was then transported to room #12 and placed under general anesthesia without  difficulty.  She was then placed in a semi-sitting position with a shoulder frame.  Examination of right shoulder revealed no evidence of instability or adhesive capsulitis.  It was accordingly prepped with DuraPrep in the base of the neck circumferentially.  The wrist sterile draping was performed.  Time-out was called.  The prior mini-open incision was utilized, about an inch and a half in length and via sharp dissection carried down the subcutaneous tissue. Superficial scar tissue was incised to the level of the deltoid fascia. This was incised along the previous line.  The subacromial space was entered.  Self-retaining retractor was inserted.  They remember of sutures that were in view, which were removed.  The rotator cuff tear was complete.  There was retraction.  There was quite a bit of atrophy.  The biceps tendon was absent and was not in its groove.  I did irrigate the joint and again removed any extraneous nonabsorbable sutures.  I was able to advance by breaking up the scar with my gloved finger.  I was able to advance the infra and supraspinatus suture at its apex, but I could not quite complete the reapproximation towards the humeral head and accordingly applied the second dermis band patch under tension with 2-0 Ethibond suture.  I felt like the cuff was massive.  It was not completely repairable and there was quite a bit of atrophy, but hopefully it will eliminate some pain.  I am not sure, she will gain full function.  At that point, the wound was irrigated with saline solution.  The deltoid fascia closed with a running 0 Vicryl subcu with 3-0 Monocryl.  Skin closed with skin clips with Steri-Strips over benzoin.  Sterile bulky dressing was applied via sling.  PLAN:  Dilaudid for pain.  Zofran for nausea.     Vonna Kotyk. Durward Fortes, M.D.     PWW/MEDQ  D:  05/20/2015  T:  05/20/2015  Job:  [id number]

## 2015-05-20 NOTE — Anesthesia Postprocedure Evaluation (Signed)
Anesthesia Post Note  Patient: Cynthia [name]  Procedure(s) Performed: Procedure(s) (LRB): RE-REPAIR RIGHT ROTATOR CUFF WITH DERMASPAN PATCH. (Right)  Patient location during evaluation: PACU Anesthesia Type: General Level of consciousness: sedated Pain management: pain level controlled Vital Signs Assessment: post-procedure vital signs reviewed and stable Respiratory status: spontaneous breathing and respiratory function stable Cardiovascular status: stable Anesthetic complications: no    Last Vitals:  Filed Vitals:   05/20/15 1032 05/20/15 1038  BP: 99/86 112/70  Pulse: 71 71  Temp:    Resp: 14 14    Last Pain:  Filed Vitals:   05/20/15 1044  PainSc: [place]

## 2015-05-20 NOTE — H&P (Signed)
  The recent History & Physical has been reviewed. I have personally examined the patient today. There is no interval change to the documented History & Physical. The patient would like to proceed with the procedure.  WHITFIELD, PETER W 05/20/2015,  7:10 AM

## 2015-05-20 NOTE — Anesthesia Procedure Notes (Addendum)
Procedure Name: Intubation Date/Time: 05/20/2015 7:38 AM Performed by: Neldon Newport Pre-anesthesia Checklist: Patient being monitored, Suction available, Emergency Drugs available, Patient identified and Timeout performed Patient Re-evaluated:Patient Re-evaluated prior to inductionOxygen Delivery Method: Circle system utilized Preoxygenation: Pre-oxygenation with 100% oxygen Intubation Type: IV induction Ventilation: Mask ventilation without difficulty Laryngoscope Size: Mac and 3 Grade View: Grade I Tube type: Oral Tube size: 7.0 mm Number of attempts: 1 Placement Confirmation: positive ETCO2,  ETT inserted through vocal cords under direct vision and breath sounds checked- equal and bilateral Secured at: 20 cm Tube secured with: Tape Dental Injury: Teeth and Oropharynx as per pre-operative assessment    Anesthesia Regional Block:  Interscalene brachial plexus block  Pre-Anesthetic Checklist: ,, timeout performed, Correct Patient, Correct Site, Correct Laterality, Correct Procedure, Correct Position, site marked, Risks and benefits discussed,  Surgical consent,  Pre-op evaluation,  At surgeon's request and post-op pain management  Laterality: Right  Prep: chloraprep       Needles:  Injection technique: Single-shot  Needle Type: Echogenic Stimulator Needle     Needle Length: 5cm 5 cm Needle Gauge: 22 and 22 G    Additional Needles:  Procedures: ultrasound guided (picture in chart) and nerve stimulator Interscalene brachial plexus block  Nerve Stimulator or Paresthesia:  Response: bicep contraction, 0.45 mA,   Additional Responses:   Narrative:  Start time: 05/20/2015 6:58 AM End time: 05/20/2015 7:08 AM Injection made incrementally with aspirations every 5 mL.  Performed by: Personally  Anesthesiologist: Duane Boston  Additional Notes: Functioning IV was confirmed and monitors applied.  A 61mm 22ga echogenic arrow stimulator was used. Sterile prep and  drape,hand hygiene and sterile gloves were used.Ultrasound guidance: relevant anatomy identified, needle position confirmed, local anesthetic spread visualized around nerve(s)., vascular puncture avoided.  Image printed for medical record.  Negative aspiration and negative test dose prior to incremental administration of local anesthetic. The patient tolerated the procedure well.

## 2015-05-21 ENCOUNTER — Encounter (HOSPITAL_COMMUNITY): Payer: Self-pay | Admitting: Orthopaedic Surgery

## 2015-06-11 ENCOUNTER — Ambulatory Visit: Payer: Medicare Other | Attending: Orthopaedic Surgery

## 2015-06-11 DIAGNOSIS — M25511 Pain in right shoulder: Secondary | ICD-10-CM | POA: Diagnosis not present

## 2015-06-11 DIAGNOSIS — Z9889 Other specified postprocedural states: Secondary | ICD-10-CM | POA: Diagnosis not present

## 2015-06-11 DIAGNOSIS — R600 Localized edema: Secondary | ICD-10-CM | POA: Insufficient documentation

## 2015-06-11 DIAGNOSIS — R6889 Other general symptoms and signs: Secondary | ICD-10-CM | POA: Insufficient documentation

## 2015-06-11 DIAGNOSIS — M6281 Muscle weakness (generalized): Secondary | ICD-10-CM | POA: Insufficient documentation

## 2015-06-11 DIAGNOSIS — M25611 Stiffness of right shoulder, not elsewhere classified: Secondary | ICD-10-CM

## 2015-06-11 DIAGNOSIS — R531 Weakness: Secondary | ICD-10-CM
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
## 2015-06-11 NOTE — Patient Instructions (Signed)
Activity restriction: do not use R UE.  Wear sling at all times, Ice 20 mins , 3 times a day PT POC, 2 x a week initially, may increase to 3 times a week if needed.  Positioning with pillows at night in resting position Posture and relaxing mm (upper trap) at rest

## 2015-06-12 ENCOUNTER — Ambulatory Visit: Payer: Medicare Other | Admitting: Physical Therapy

## 2015-06-12 DIAGNOSIS — R600 Localized edema: Secondary | ICD-10-CM | POA: Diagnosis not present

## 2015-06-12 DIAGNOSIS — R6889 Other general symptoms and signs: Secondary | ICD-10-CM

## 2015-06-12 DIAGNOSIS — M25511 Pain in right shoulder: Secondary | ICD-10-CM | POA: Diagnosis not present

## 2015-06-12 DIAGNOSIS — Z9889 Other specified postprocedural states: Secondary | ICD-10-CM | POA: Diagnosis not present

## 2015-06-12 DIAGNOSIS — R531 Weakness: Secondary | ICD-10-CM

## 2015-06-12 DIAGNOSIS — M6281 Muscle weakness (generalized): Secondary | ICD-10-CM | POA: Diagnosis not present

## 2015-06-12 DIAGNOSIS — M25611 Stiffness of right shoulder, not elsewhere classified: Secondary | ICD-10-CM
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
## 2015-06-17 ENCOUNTER — Ambulatory Visit: Payer: Medicare Other | Admitting: Physical Therapy

## 2015-06-17 DIAGNOSIS — Z9889 Other specified postprocedural states: Secondary | ICD-10-CM

## 2015-06-17 DIAGNOSIS — R531 Weakness: Secondary | ICD-10-CM

## 2015-06-17 DIAGNOSIS — R600 Localized edema: Secondary | ICD-10-CM | POA: Diagnosis not present

## 2015-06-17 DIAGNOSIS — M25511 Pain in right shoulder: Secondary | ICD-10-CM

## 2015-06-17 DIAGNOSIS — R6889 Other general symptoms and signs: Secondary | ICD-10-CM

## 2015-06-17 DIAGNOSIS — M25611 Stiffness of right shoulder, not elsewhere classified: Secondary | ICD-10-CM

## 2015-06-17 DIAGNOSIS — M6281 Muscle weakness (generalized): Secondary | ICD-10-CM | POA: Diagnosis not present
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
## 2015-06-19 ENCOUNTER — Ambulatory Visit: Payer: Medicare Other | Admitting: Physical Therapy

## 2015-06-19 DIAGNOSIS — M6281 Muscle weakness (generalized): Secondary | ICD-10-CM | POA: Diagnosis not present

## 2015-06-19 DIAGNOSIS — Z9889 Other specified postprocedural states: Secondary | ICD-10-CM | POA: Diagnosis not present

## 2015-06-19 DIAGNOSIS — R531 Weakness: Secondary | ICD-10-CM

## 2015-06-19 DIAGNOSIS — M25611 Stiffness of right shoulder, not elsewhere classified: Secondary | ICD-10-CM

## 2015-06-19 DIAGNOSIS — R6889 Other general symptoms and signs: Secondary | ICD-10-CM

## 2015-06-19 DIAGNOSIS — M25511 Pain in right shoulder: Secondary | ICD-10-CM

## 2015-06-19 DIAGNOSIS — R600 Localized edema: Secondary | ICD-10-CM | POA: Diagnosis not present

## 2015-06-19 NOTE — Patient Instructions (Signed)
Levator Stretch   Grasp seat or sit on hand on side to be stretched. Turn head toward other side and look down. Use hand on head to gently stretch neck in that position. Hold __30__ seconds. Repeat on other side. Repeat _3___ times. Do __2__ sessions per day.  http://gt2.exer.us/30   Copyright  VHI. All rights reserved.  Side-Bending   One hand on opposite side of head, pull head to side as far as is comfortable. Stop if there is pain. Hold _30___ seconds. Repeat with other hand to other side. Repeat __3__ times. Do _2___ sessions per day.   Copyright  VHI. All rights reserved.  Scapular Retraction (Standing)   With arms at sides, pinch shoulder blades together. Hold 5 seconds Repeat __10__ times per set. Do __2__ sets per session. Do __2__ sessions per day.
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
## 2015-06-24 ENCOUNTER — Ambulatory Visit: Payer: Medicare Other

## 2015-06-24 DIAGNOSIS — R600 Localized edema: Secondary | ICD-10-CM | POA: Diagnosis not present

## 2015-06-24 DIAGNOSIS — M25511 Pain in right shoulder: Secondary | ICD-10-CM

## 2015-06-24 DIAGNOSIS — R531 Weakness: Secondary | ICD-10-CM

## 2015-06-24 DIAGNOSIS — Z9889 Other specified postprocedural states: Secondary | ICD-10-CM

## 2015-06-24 DIAGNOSIS — M6281 Muscle weakness (generalized): Secondary | ICD-10-CM | POA: Diagnosis not present

## 2015-06-24 DIAGNOSIS — M25611 Stiffness of right shoulder, not elsewhere classified: Secondary | ICD-10-CM

## 2015-06-24 DIAGNOSIS — R6889 Other general symptoms and signs: Secondary | ICD-10-CM | POA: Diagnosis not present
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
## 2015-06-27 ENCOUNTER — Ambulatory Visit: Payer: Medicare Other | Attending: Orthopaedic Surgery | Admitting: Physical Therapy

## 2015-06-27 DIAGNOSIS — M6281 Muscle weakness (generalized): Secondary | ICD-10-CM | POA: Diagnosis not present

## 2015-06-27 DIAGNOSIS — R531 Weakness: Secondary | ICD-10-CM

## 2015-06-27 DIAGNOSIS — M25511 Pain in right shoulder: Secondary | ICD-10-CM | POA: Insufficient documentation

## 2015-06-27 DIAGNOSIS — R6889 Other general symptoms and signs: Secondary | ICD-10-CM | POA: Insufficient documentation

## 2015-06-27 DIAGNOSIS — Z9889 Other specified postprocedural states: Secondary | ICD-10-CM | POA: Insufficient documentation

## 2015-06-27 DIAGNOSIS — R600 Localized edema: Secondary | ICD-10-CM

## 2015-06-27 DIAGNOSIS — M25611 Stiffness of right shoulder, not elsewhere classified: Secondary | ICD-10-CM
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
## 2015-07-01 ENCOUNTER — Ambulatory Visit: Payer: Medicare Other | Admitting: Physical Therapy

## 2015-07-01 DIAGNOSIS — M6281 Muscle weakness (generalized): Secondary | ICD-10-CM | POA: Diagnosis not present

## 2015-07-01 DIAGNOSIS — R600 Localized edema: Secondary | ICD-10-CM | POA: Diagnosis not present

## 2015-07-01 DIAGNOSIS — R6889 Other general symptoms and signs: Secondary | ICD-10-CM | POA: Diagnosis not present

## 2015-07-01 DIAGNOSIS — R531 Weakness: Secondary | ICD-10-CM

## 2015-07-01 DIAGNOSIS — M25511 Pain in right shoulder: Secondary | ICD-10-CM

## 2015-07-01 DIAGNOSIS — Z9889 Other specified postprocedural states: Secondary | ICD-10-CM

## 2015-07-01 DIAGNOSIS — M25611 Stiffness of right shoulder, not elsewhere classified: Secondary | ICD-10-CM
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
## 2015-07-03 ENCOUNTER — Ambulatory Visit: Payer: Medicare Other

## 2015-07-03 DIAGNOSIS — M6281 Muscle weakness (generalized): Secondary | ICD-10-CM | POA: Diagnosis not present

## 2015-07-03 DIAGNOSIS — R531 Weakness: Secondary | ICD-10-CM

## 2015-07-03 DIAGNOSIS — Z9889 Other specified postprocedural states: Secondary | ICD-10-CM | POA: Diagnosis not present

## 2015-07-03 DIAGNOSIS — R6889 Other general symptoms and signs: Secondary | ICD-10-CM

## 2015-07-03 DIAGNOSIS — R600 Localized edema: Secondary | ICD-10-CM

## 2015-07-03 DIAGNOSIS — M25511 Pain in right shoulder: Secondary | ICD-10-CM

## 2015-07-03 DIAGNOSIS — M25611 Stiffness of right shoulder, not elsewhere classified: Secondary | ICD-10-CM

## 2015-07-03 NOTE — Patient Instructions (Signed)
Flexion (Passive)    Sitting upright, slide forearm forward along table, bending from the waist until a stretch is felt. Hold __10__ seconds. Repeat _10___ times. Do __3__ sessions per day.  Copyright  VHI. All rights reserved.   External Rotation (Assistive)    Seated with elbow bent at right angle and held against side, slide arm on table surface in an outward arc. Repeat __10__ times. Do __3__ sessions per day. Activity: Use this motion to push light object out of the way.  Copyright  VHI. All rights reserved.
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
## 2015-07-08 ENCOUNTER — Ambulatory Visit: Payer: Medicare Other | Admitting: Physical Therapy

## 2015-07-08 DIAGNOSIS — R600 Localized edema: Secondary | ICD-10-CM | POA: Diagnosis not present

## 2015-07-08 DIAGNOSIS — M25511 Pain in right shoulder: Secondary | ICD-10-CM

## 2015-07-08 DIAGNOSIS — M6281 Muscle weakness (generalized): Secondary | ICD-10-CM | POA: Diagnosis not present

## 2015-07-08 DIAGNOSIS — R531 Weakness: Secondary | ICD-10-CM

## 2015-07-08 DIAGNOSIS — Z9889 Other specified postprocedural states: Secondary | ICD-10-CM

## 2015-07-08 DIAGNOSIS — M25611 Stiffness of right shoulder, not elsewhere classified: Secondary | ICD-10-CM

## 2015-07-08 DIAGNOSIS — R6889 Other general symptoms and signs: Secondary | ICD-10-CM | POA: Diagnosis not present
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
## 2015-07-09 ENCOUNTER — Encounter: Payer: Medicare Other | Admitting: Physical Therapy

## 2015-07-10 ENCOUNTER — Encounter: Payer: Medicare Other | Admitting: Physical Therapy

## 2015-07-15 ENCOUNTER — Ambulatory Visit: Payer: Medicare Other

## 2015-07-15 DIAGNOSIS — Z9889 Other specified postprocedural states: Secondary | ICD-10-CM

## 2015-07-15 DIAGNOSIS — R600 Localized edema: Secondary | ICD-10-CM | POA: Diagnosis not present

## 2015-07-15 DIAGNOSIS — M25611 Stiffness of right shoulder, not elsewhere classified: Secondary | ICD-10-CM

## 2015-07-15 DIAGNOSIS — M25511 Pain in right shoulder: Secondary | ICD-10-CM | POA: Diagnosis not present

## 2015-07-15 DIAGNOSIS — R531 Weakness: Secondary | ICD-10-CM

## 2015-07-15 DIAGNOSIS — R6889 Other general symptoms and signs: Secondary | ICD-10-CM | POA: Diagnosis not present

## 2015-07-15 DIAGNOSIS — M6281 Muscle weakness (generalized): Secondary | ICD-10-CM | POA: Diagnosis not present
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
## 2015-07-17 ENCOUNTER — Ambulatory Visit: Payer: Medicare Other

## 2015-07-17 DIAGNOSIS — Z9889 Other specified postprocedural states: Secondary | ICD-10-CM

## 2015-07-17 DIAGNOSIS — R6889 Other general symptoms and signs: Secondary | ICD-10-CM

## 2015-07-17 DIAGNOSIS — R531 Weakness: Secondary | ICD-10-CM

## 2015-07-17 DIAGNOSIS — R600 Localized edema: Secondary | ICD-10-CM | POA: Diagnosis not present

## 2015-07-17 DIAGNOSIS — M6281 Muscle weakness (generalized): Secondary | ICD-10-CM | POA: Diagnosis not present

## 2015-07-17 DIAGNOSIS — M25611 Stiffness of right shoulder, not elsewhere classified: Secondary | ICD-10-CM

## 2015-07-17 DIAGNOSIS — M25511 Pain in right shoulder: Secondary | ICD-10-CM
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
## 2015-07-22 ENCOUNTER — Ambulatory Visit: Payer: Medicare Other

## 2015-07-22 DIAGNOSIS — M6281 Muscle weakness (generalized): Secondary | ICD-10-CM | POA: Diagnosis not present

## 2015-07-22 DIAGNOSIS — M25511 Pain in right shoulder: Secondary | ICD-10-CM | POA: Diagnosis not present

## 2015-07-22 DIAGNOSIS — R600 Localized edema: Secondary | ICD-10-CM

## 2015-07-22 DIAGNOSIS — Z9889 Other specified postprocedural states: Secondary | ICD-10-CM | POA: Diagnosis not present

## 2015-07-22 DIAGNOSIS — R6889 Other general symptoms and signs: Secondary | ICD-10-CM | POA: Diagnosis not present

## 2015-07-22 DIAGNOSIS — M25611 Stiffness of right shoulder, not elsewhere classified: Secondary | ICD-10-CM

## 2015-07-22 DIAGNOSIS — R531 Weakness: Secondary | ICD-10-CM
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
## 2015-07-24 ENCOUNTER — Ambulatory Visit: Payer: Medicare Other | Attending: Orthopaedic Surgery

## 2015-07-24 DIAGNOSIS — R6889 Other general symptoms and signs: Secondary | ICD-10-CM | POA: Diagnosis not present

## 2015-07-24 DIAGNOSIS — R531 Weakness: Secondary | ICD-10-CM

## 2015-07-24 DIAGNOSIS — M6281 Muscle weakness (generalized): Secondary | ICD-10-CM | POA: Insufficient documentation

## 2015-07-24 DIAGNOSIS — M25511 Pain in right shoulder: Secondary | ICD-10-CM | POA: Diagnosis not present

## 2015-07-24 DIAGNOSIS — R600 Localized edema: Secondary | ICD-10-CM | POA: Diagnosis not present

## 2015-07-24 DIAGNOSIS — Z9889 Other specified postprocedural states: Secondary | ICD-10-CM | POA: Diagnosis not present

## 2015-07-24 DIAGNOSIS — M25611 Stiffness of right shoulder, not elsewhere classified: Secondary | ICD-10-CM
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
## 2015-07-29 ENCOUNTER — Ambulatory Visit: Payer: Medicare Other

## 2015-07-29 DIAGNOSIS — M6281 Muscle weakness (generalized): Secondary | ICD-10-CM | POA: Diagnosis not present

## 2015-07-29 DIAGNOSIS — R531 Weakness: Secondary | ICD-10-CM

## 2015-07-29 DIAGNOSIS — R600 Localized edema: Secondary | ICD-10-CM | POA: Diagnosis not present

## 2015-07-29 DIAGNOSIS — Z9889 Other specified postprocedural states: Secondary | ICD-10-CM | POA: Diagnosis not present

## 2015-07-29 DIAGNOSIS — M25611 Stiffness of right shoulder, not elsewhere classified: Secondary | ICD-10-CM

## 2015-07-29 DIAGNOSIS — R6889 Other general symptoms and signs: Secondary | ICD-10-CM | POA: Diagnosis not present

## 2015-07-29 DIAGNOSIS — M25511 Pain in right shoulder: Secondary | ICD-10-CM | POA: Diagnosis not present
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
## 2015-07-31 ENCOUNTER — Ambulatory Visit: Payer: Medicare Other

## 2015-07-31 DIAGNOSIS — R6889 Other general symptoms and signs: Secondary | ICD-10-CM | POA: Diagnosis not present

## 2015-07-31 DIAGNOSIS — Z9889 Other specified postprocedural states: Secondary | ICD-10-CM | POA: Diagnosis not present

## 2015-07-31 DIAGNOSIS — M6281 Muscle weakness (generalized): Secondary | ICD-10-CM | POA: Diagnosis not present

## 2015-07-31 DIAGNOSIS — R600 Localized edema: Secondary | ICD-10-CM

## 2015-07-31 DIAGNOSIS — M25611 Stiffness of right shoulder, not elsewhere classified: Secondary | ICD-10-CM

## 2015-07-31 DIAGNOSIS — M25511 Pain in right shoulder: Secondary | ICD-10-CM

## 2015-07-31 DIAGNOSIS — R531 Weakness: Secondary | ICD-10-CM
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
## 2015-07-31 NOTE — Patient Instructions (Signed)
  10 , 5 sec holds, 2 times a day. Sub maximal, and pain- free.   Strengthening: Isometric Flexion  Using wall for resistance, press right fist into ball using light pressure. Hold ____ seconds. Repeat ____ times per set. Do ____ sets per session. Do ____ sessions per day.  SHOULDER: Abduction (Isometric)  Use wall as resistance. Press arm against pillow. Keep elbow straight. Hold ___ seconds. ___ reps per set, ___ sets per day, ___ days per week  Extension (Isometric)  Place left bent elbow and back of arm against wall. Press elbow against wall. Hold ____ seconds. Repeat ____ times. Do ____ sessions per day.  Internal Rotation (Isometric)  Place palm of right fist against door frame, with elbow bent. Press fist against door frame. Hold ____ seconds. Repeat ____ times. Do ____ sessions per day.  External Rotation (Isometric)  Place back of left fist against door frame, with elbow bent. Press fist against door frame. Hold ____ seconds. Repeat ____ times. Do ____ sessions per day.  Copyright  VHI. All rights reserved.   Wall slides 10 x, use L arm to assist R. 2 x a day

## 2015-08-05 ENCOUNTER — Ambulatory Visit: Payer: Medicare Other

## 2015-08-05 DIAGNOSIS — Z9889 Other specified postprocedural states: Secondary | ICD-10-CM | POA: Diagnosis not present

## 2015-08-05 DIAGNOSIS — R600 Localized edema: Secondary | ICD-10-CM

## 2015-08-05 DIAGNOSIS — M25511 Pain in right shoulder: Secondary | ICD-10-CM

## 2015-08-05 DIAGNOSIS — M25611 Stiffness of right shoulder, not elsewhere classified: Secondary | ICD-10-CM

## 2015-08-05 DIAGNOSIS — R6889 Other general symptoms and signs: Secondary | ICD-10-CM | POA: Diagnosis not present

## 2015-08-05 DIAGNOSIS — M6281 Muscle weakness (generalized): Secondary | ICD-10-CM | POA: Diagnosis not present

## 2015-08-05 DIAGNOSIS — R531 Weakness: Secondary | ICD-10-CM
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
## 2015-08-05 NOTE — Patient Instructions (Signed)
Flexion (Eccentric) - Active (Cane)    Lift cane with both hands. Avoid hiking shoulders. Lower cane slowly for 3-5 seconds. _10 __ reps per set, _2__ sets per day, http://ecce.exer.us/155   Copyright  VHI. All rights reserved.

## 2015-08-07 ENCOUNTER — Ambulatory Visit: Payer: Medicare Other

## 2015-08-07 DIAGNOSIS — R531 Weakness: Secondary | ICD-10-CM

## 2015-08-07 DIAGNOSIS — M25511 Pain in right shoulder: Secondary | ICD-10-CM | POA: Diagnosis not present

## 2015-08-07 DIAGNOSIS — M6281 Muscle weakness (generalized): Secondary | ICD-10-CM | POA: Diagnosis not present

## 2015-08-07 DIAGNOSIS — R600 Localized edema: Secondary | ICD-10-CM | POA: Diagnosis not present

## 2015-08-07 DIAGNOSIS — Z9889 Other specified postprocedural states: Secondary | ICD-10-CM | POA: Diagnosis not present

## 2015-08-07 DIAGNOSIS — R6889 Other general symptoms and signs: Secondary | ICD-10-CM | POA: Diagnosis not present

## 2015-08-07 DIAGNOSIS — M25611 Stiffness of right shoulder, not elsewhere classified: Secondary | ICD-10-CM
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
## 2015-08-11 ENCOUNTER — Ambulatory Visit: Payer: Medicare Other

## 2015-08-13 ENCOUNTER — Ambulatory Visit: Payer: Medicare Other

## 2015-08-13 DIAGNOSIS — M25511 Pain in right shoulder: Secondary | ICD-10-CM

## 2015-08-13 DIAGNOSIS — Z9889 Other specified postprocedural states: Secondary | ICD-10-CM | POA: Diagnosis not present

## 2015-08-13 DIAGNOSIS — M25611 Stiffness of right shoulder, not elsewhere classified: Secondary | ICD-10-CM

## 2015-08-13 DIAGNOSIS — R531 Weakness: Secondary | ICD-10-CM

## 2015-08-13 DIAGNOSIS — R600 Localized edema: Secondary | ICD-10-CM

## 2015-08-13 DIAGNOSIS — R6889 Other general symptoms and signs: Secondary | ICD-10-CM | POA: Diagnosis not present

## 2015-08-13 DIAGNOSIS — M6281 Muscle weakness (generalized): Secondary | ICD-10-CM | POA: Diagnosis not present
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
## 2015-08-19 ENCOUNTER — Ambulatory Visit: Payer: Medicare Other

## 2015-08-19 DIAGNOSIS — M25511 Pain in right shoulder: Secondary | ICD-10-CM

## 2015-08-19 DIAGNOSIS — R6889 Other general symptoms and signs: Secondary | ICD-10-CM | POA: Diagnosis not present

## 2015-08-19 DIAGNOSIS — M25611 Stiffness of right shoulder, not elsewhere classified: Secondary | ICD-10-CM

## 2015-08-19 DIAGNOSIS — R531 Weakness: Secondary | ICD-10-CM

## 2015-08-19 DIAGNOSIS — Z9889 Other specified postprocedural states: Secondary | ICD-10-CM

## 2015-08-19 DIAGNOSIS — M6281 Muscle weakness (generalized): Secondary | ICD-10-CM | POA: Diagnosis not present

## 2015-08-19 DIAGNOSIS — R600 Localized edema: Secondary | ICD-10-CM | POA: Diagnosis not present
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
## 2015-08-21 ENCOUNTER — Ambulatory Visit: Payer: Medicare Other

## 2015-08-21 DIAGNOSIS — M6281 Muscle weakness (generalized): Secondary | ICD-10-CM | POA: Diagnosis not present

## 2015-08-21 DIAGNOSIS — R600 Localized edema: Secondary | ICD-10-CM | POA: Diagnosis not present

## 2015-08-21 DIAGNOSIS — M25511 Pain in right shoulder: Secondary | ICD-10-CM

## 2015-08-21 DIAGNOSIS — Z9889 Other specified postprocedural states: Secondary | ICD-10-CM

## 2015-08-21 DIAGNOSIS — R531 Weakness: Secondary | ICD-10-CM

## 2015-08-21 DIAGNOSIS — R6889 Other general symptoms and signs: Secondary | ICD-10-CM | POA: Diagnosis not present
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
## 2015-08-27 ENCOUNTER — Ambulatory Visit: Payer: Medicare Other | Attending: Orthopaedic Surgery

## 2015-08-27 DIAGNOSIS — M6281 Muscle weakness (generalized): Secondary | ICD-10-CM | POA: Diagnosis not present

## 2015-08-27 DIAGNOSIS — R600 Localized edema: Secondary | ICD-10-CM | POA: Insufficient documentation

## 2015-08-27 DIAGNOSIS — M25511 Pain in right shoulder: Secondary | ICD-10-CM | POA: Diagnosis not present
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
## 2015-09-02 ENCOUNTER — Encounter: Payer: Self-pay | Admitting: Physical Therapy

## 2015-09-02 ENCOUNTER — Ambulatory Visit: Payer: Medicare Other | Admitting: Physical Therapy

## 2015-09-02 DIAGNOSIS — M25511 Pain in right shoulder: Secondary | ICD-10-CM

## 2015-09-02 DIAGNOSIS — R600 Localized edema: Secondary | ICD-10-CM

## 2015-09-02 DIAGNOSIS — M6281 Muscle weakness (generalized): Secondary | ICD-10-CM
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
## 2015-09-04 ENCOUNTER — Ambulatory Visit: Payer: Medicare Other

## 2015-09-04 DIAGNOSIS — R600 Localized edema: Secondary | ICD-10-CM

## 2015-09-04 DIAGNOSIS — M6281 Muscle weakness (generalized): Secondary | ICD-10-CM

## 2015-09-04 DIAGNOSIS — M25511 Pain in right shoulder: Secondary | ICD-10-CM
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
## 2015-09-09 ENCOUNTER — Ambulatory Visit: Payer: Medicare Other | Admitting: Physical Therapy

## 2015-09-09 DIAGNOSIS — M6281 Muscle weakness (generalized): Secondary | ICD-10-CM | POA: Diagnosis not present

## 2015-09-09 DIAGNOSIS — R600 Localized edema: Secondary | ICD-10-CM

## 2015-09-09 DIAGNOSIS — M25511 Pain in right shoulder: Secondary | ICD-10-CM | POA: Diagnosis not present
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
## 2015-09-11 ENCOUNTER — Ambulatory Visit: Payer: Medicare Other | Admitting: Physical Therapy

## 2015-09-11 DIAGNOSIS — R600 Localized edema: Secondary | ICD-10-CM

## 2015-09-11 DIAGNOSIS — M6281 Muscle weakness (generalized): Secondary | ICD-10-CM | POA: Diagnosis not present

## 2015-09-11 DIAGNOSIS — M25511 Pain in right shoulder: Secondary | ICD-10-CM | POA: Diagnosis not present
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
## 2015-09-16 ENCOUNTER — Ambulatory Visit: Payer: Medicare Other | Admitting: Physical Therapy

## 2015-09-16 DIAGNOSIS — R600 Localized edema: Secondary | ICD-10-CM | POA: Diagnosis not present

## 2015-09-16 DIAGNOSIS — M6281 Muscle weakness (generalized): Secondary | ICD-10-CM

## 2015-09-16 DIAGNOSIS — M25511 Pain in right shoulder: Secondary | ICD-10-CM
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
## 2015-09-18 ENCOUNTER — Encounter: Payer: Medicare Other | Admitting: Physical Therapy

## 2015-09-23 ENCOUNTER — Encounter: Payer: Medicare Other | Admitting: Physical Therapy

## 2015-09-25 ENCOUNTER — Encounter: Payer: Medicare Other | Admitting: Physical Therapy

## 2015-09-26 DIAGNOSIS — H04123 Dry eye syndrome of bilateral lacrimal glands: Secondary | ICD-10-CM | POA: Diagnosis not present

## 2015-09-26 DIAGNOSIS — H524 Presbyopia: Secondary | ICD-10-CM | POA: Diagnosis not present

## 2015-09-26 DIAGNOSIS — H2513 Age-related nuclear cataract, bilateral: Secondary | ICD-10-CM | POA: Diagnosis not present

## 2015-10-08 ENCOUNTER — Encounter (HOSPITAL_BASED_OUTPATIENT_CLINIC_OR_DEPARTMENT_OTHER): Payer: Self-pay | Admitting: Orthopaedic Surgery

## 2015-10-24 DIAGNOSIS — Z853 Personal history of malignant neoplasm of breast: Secondary | ICD-10-CM | POA: Diagnosis not present

## 2015-10-24 DIAGNOSIS — Z1231 Encounter for screening mammogram for malignant neoplasm of breast: Secondary | ICD-10-CM | POA: Diagnosis not present

## 2015-10-28 DIAGNOSIS — Z79899 Other long term (current) drug therapy: Secondary | ICD-10-CM | POA: Diagnosis not present

## 2015-10-31 ENCOUNTER — Encounter: Payer: Self-pay | Admitting: Gynecology

## 2015-11-14 DIAGNOSIS — D6489 Other specified anemias: Secondary | ICD-10-CM | POA: Diagnosis not present

## 2015-11-14 DIAGNOSIS — M8580 Other specified disorders of bone density and structure, unspecified site: Secondary | ICD-10-CM | POA: Diagnosis not present

## 2015-11-14 DIAGNOSIS — M859 Disorder of bone density and structure, unspecified: Secondary | ICD-10-CM | POA: Diagnosis not present

## 2015-11-21 DIAGNOSIS — M859 Disorder of bone density and structure, unspecified: Secondary | ICD-10-CM | POA: Diagnosis not present

## 2015-11-21 DIAGNOSIS — R0789 Other chest pain: Secondary | ICD-10-CM | POA: Diagnosis not present

## 2015-11-21 DIAGNOSIS — Z1389 Encounter for screening for other disorder: Secondary | ICD-10-CM | POA: Diagnosis not present

## 2015-11-21 DIAGNOSIS — Z0000 Encounter for general adult medical examination without abnormal findings: Secondary | ICD-10-CM | POA: Diagnosis not present

## 2015-11-21 DIAGNOSIS — M545 Low back pain: Secondary | ICD-10-CM | POA: Diagnosis not present

## 2015-11-21 DIAGNOSIS — J309 Allergic rhinitis, unspecified: Secondary | ICD-10-CM | POA: Diagnosis not present

## 2015-11-21 DIAGNOSIS — M5030 Other cervical disc degeneration, unspecified cervical region: Secondary | ICD-10-CM | POA: Diagnosis not present

## 2015-11-21 DIAGNOSIS — Z6826 Body mass index (BMI) 26.0-26.9, adult: Secondary | ICD-10-CM | POA: Diagnosis not present

## 2015-11-21 DIAGNOSIS — N3281 Overactive bladder: Secondary | ICD-10-CM | POA: Diagnosis not present

## 2015-11-21 DIAGNOSIS — M79676 Pain in unspecified toe(s): Secondary | ICD-10-CM | POA: Diagnosis not present

## 2015-11-21 DIAGNOSIS — M25511 Pain in right shoulder: Secondary | ICD-10-CM | POA: Diagnosis not present

## 2016-02-12 DIAGNOSIS — M545 Low back pain: Secondary | ICD-10-CM | POA: Diagnosis not present

## 2016-03-04 DIAGNOSIS — M545 Low back pain: Secondary | ICD-10-CM | POA: Diagnosis not present

## 2016-03-19 DIAGNOSIS — M545 Low back pain: Secondary | ICD-10-CM | POA: Diagnosis not present

## 2016-04-02 DIAGNOSIS — M1811 Unilateral primary osteoarthritis of first carpometacarpal joint, right hand: Secondary | ICD-10-CM | POA: Diagnosis not present

## 2016-04-10 DIAGNOSIS — M5136 Other intervertebral disc degeneration, lumbar region: Secondary | ICD-10-CM | POA: Diagnosis not present

## 2016-04-28 IMAGING — MR MR SHOULDER*R* W/O CM
5 of 6 series · 27 of 40 positions shown · non-contrast
Comparison: None.

CLINICAL DATA: Right shoulder pain with limited range of motion for
several months, worse over the last few weeks. Remote shoulder
surgery. Initial encounter.

EXAM:
MRI OF THE RIGHT SHOULDER WITHOUT CONTRAST
TECHNIQUE: Multiplanar, multisequence MR imaging of the shoulder was performed.
No intravenous contrast was administered.

[Series 3: T2 fat-sat · axial · 4.0mm · 0.25mm/px · z∈[-14,+71]mm · 6 of 20 slices shown (1 of 4)]
[im 1/20]
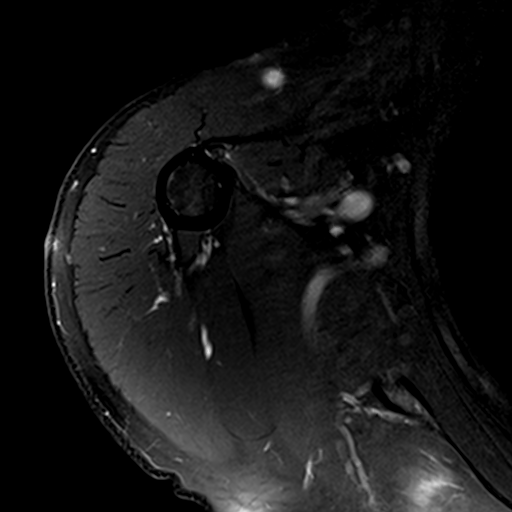
[im 4/20]
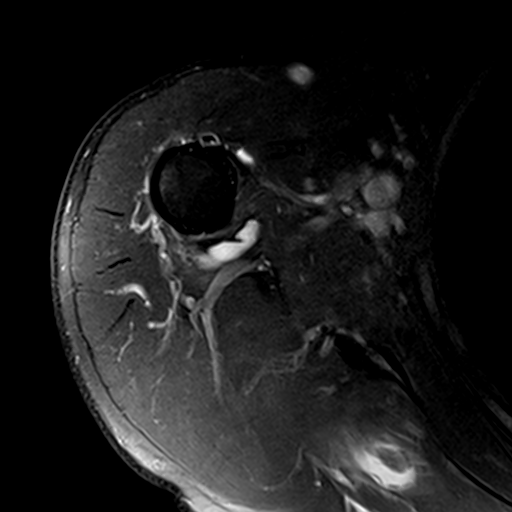
[im 8/20]
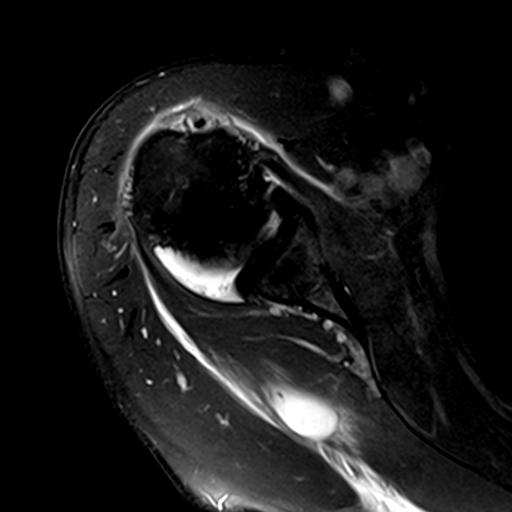
[im 12/20]
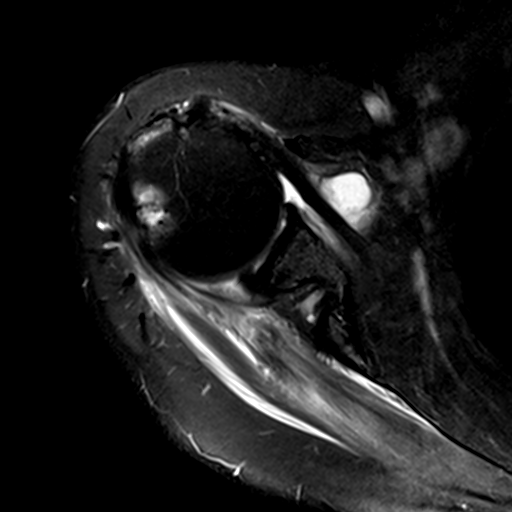
[im 16/20]
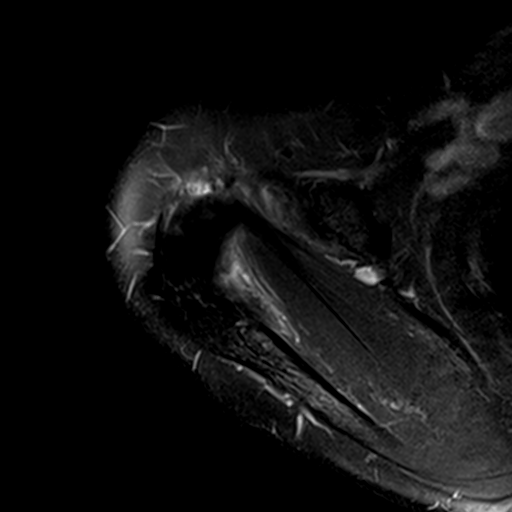
[im 20/20]
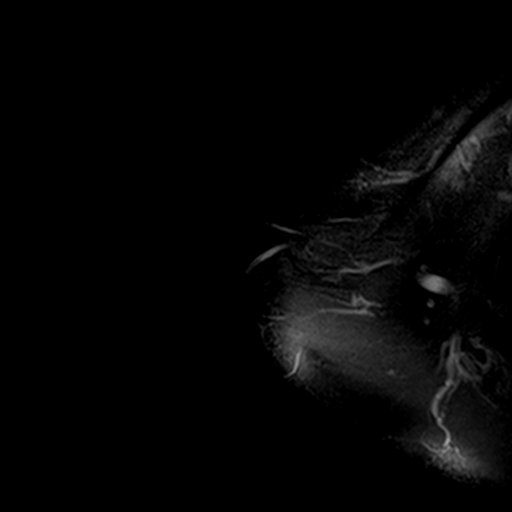

[Series 4: T2 fat-sat · oblique · 4.0mm · 0.55mm/px · 6 of 19 slices shown (2 of 4)]
[im 1/19]
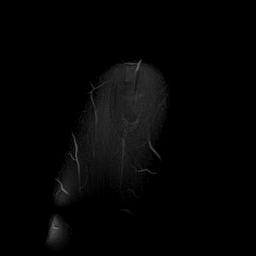
[im 4/19]
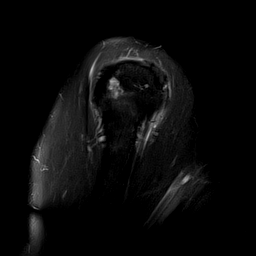
[im 8/19]
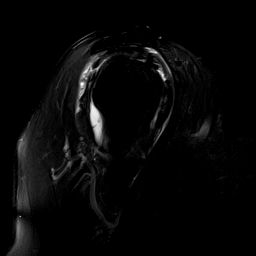
[im 11/19]
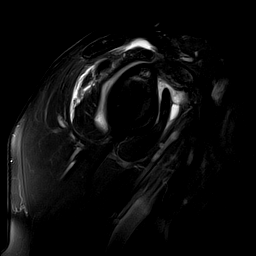
[im 15/19]
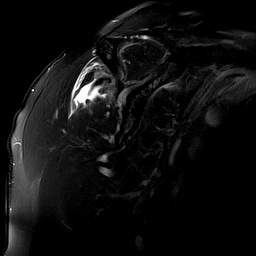
[im 19/19]
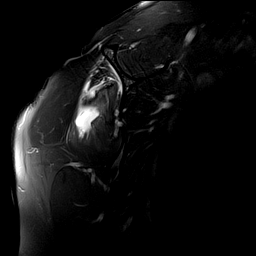

[Series 7: PD · oblique · 4.0mm · 0.29mm/px · 6 of 16 slices shown]
[im 1/16]
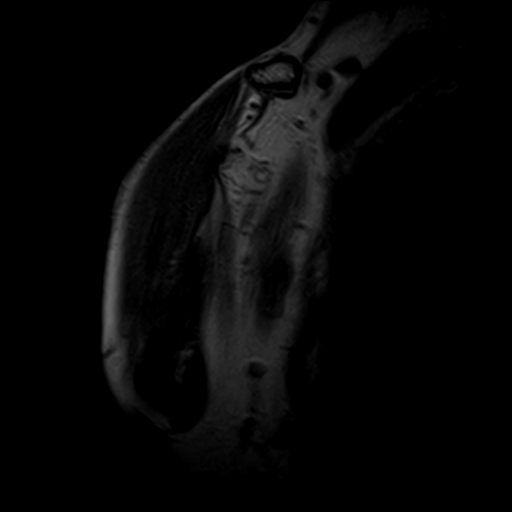
[im 4/16]
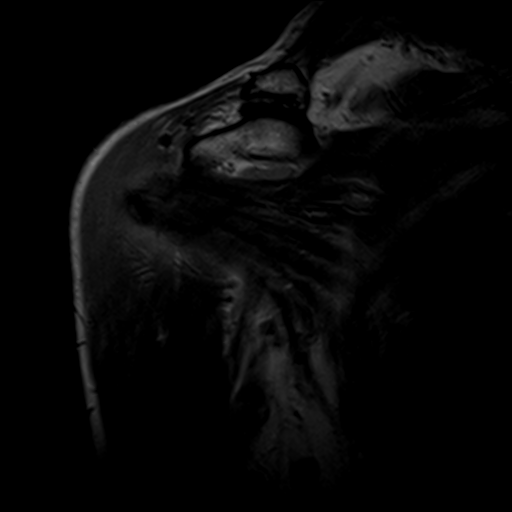
[im 7/16]
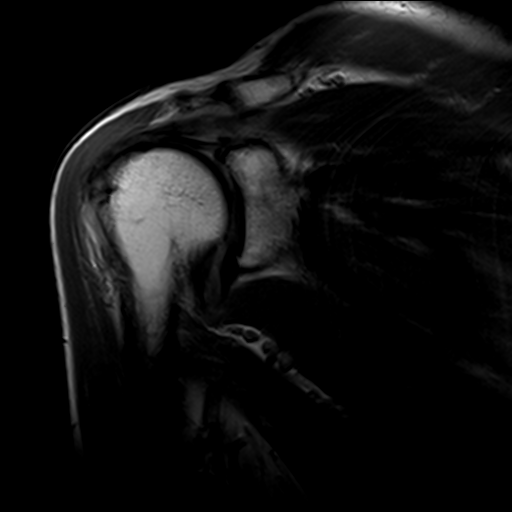
[im 10/16]
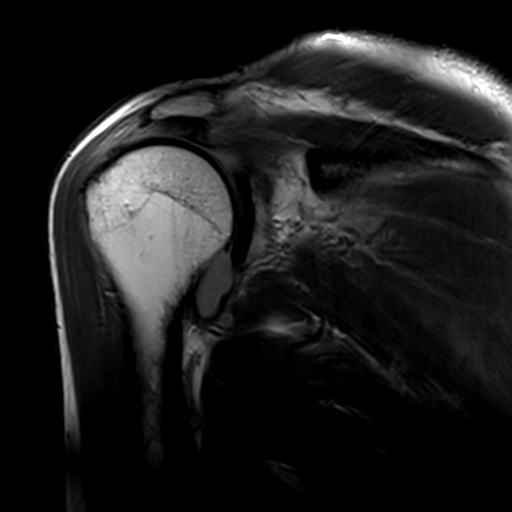
[im 13/16]
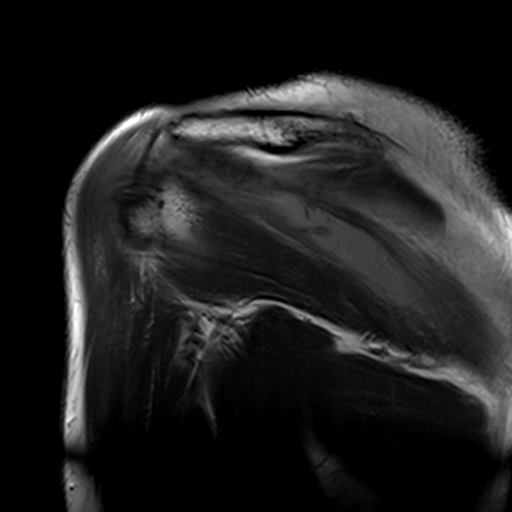
[im 16/16]
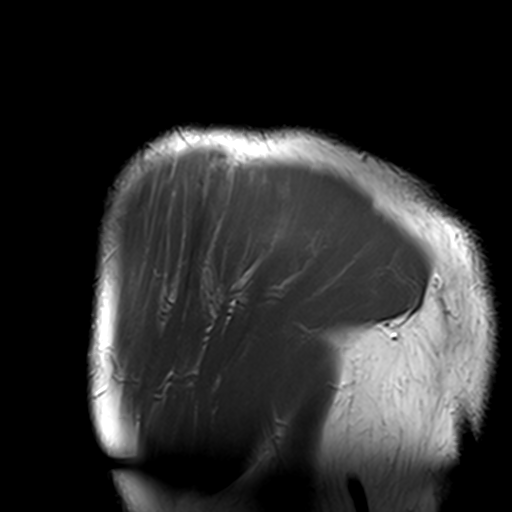

[Series 8: T2 fat-sat · oblique · 4.0mm · 0.59mm/px · 6 of 16 slices shown (3 of 4)]
[im 1/16]
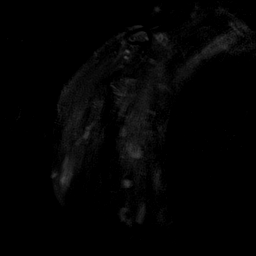
[im 4/16]
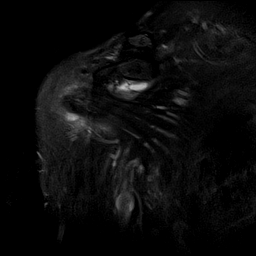
[im 7/16]
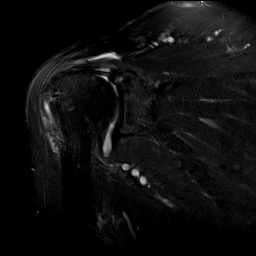
[im 10/16]
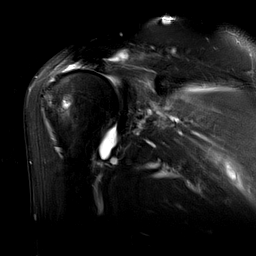
[im 13/16]
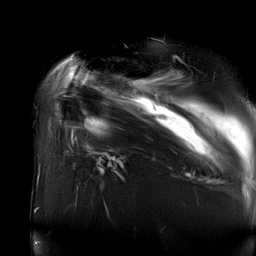
[im 16/16]
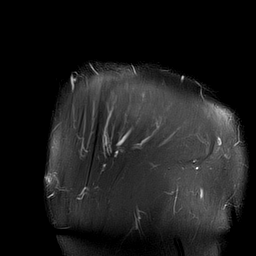

[Series 9: T2 fat-sat · axial · 4.0mm · 0.31mm/px · z∈[-37,-10]mm · 3 of 26 slices shown (4 of 4)]
[im 1/26]
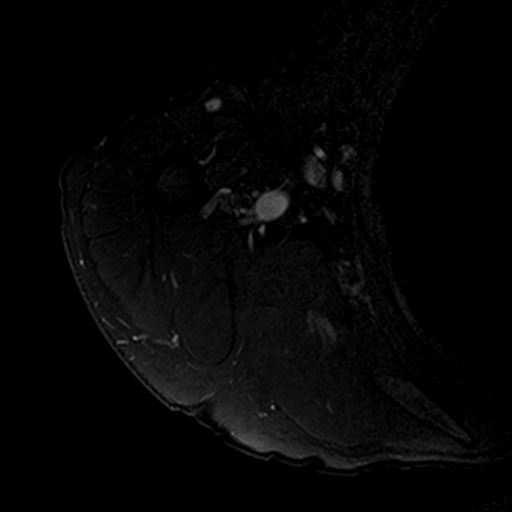
[im 4/26]
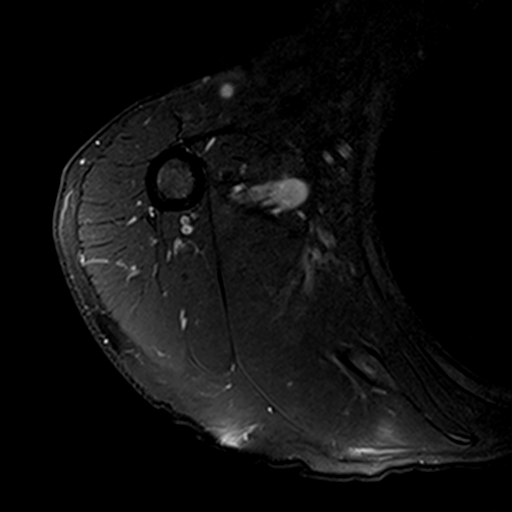
[im 7/26]
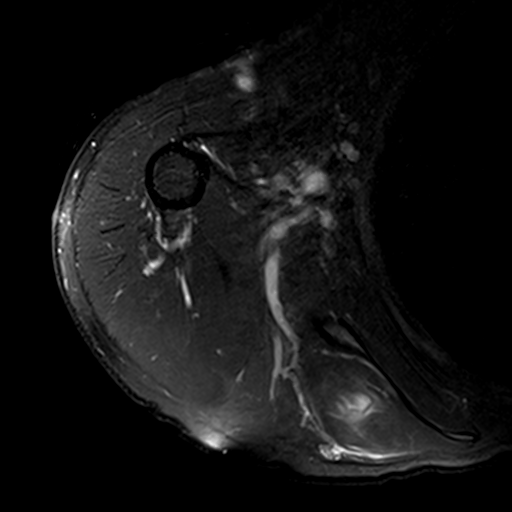

[27 of 40 positions shown; findings below may reference images not displayed]

FINDINGS: Rotator cuff: There is a large recurrent full-thickness insertional
rotator cuff tear. The supraspinatus and infraspinous tendons are
nearly completely torn and mildly retracted. The subscapularis and
teres minor tendons are intact.

Muscles: The supraspinatus muscle demonstrates no significant
atrophy. There is mild atrophy and complex fluid within the bursal
aspect of the infraspinous muscle.

Biceps long head:  Intact and normally positioned.

Acromioclavicular Joint: The acromion is type 1. Patient appears to
be status post distal clavicle resection. There is additional
susceptibility artifact in the region of the rotator interval. A
moderate amount of fluid in the subacromial-subdeltoid space
communicates with the shoulder joint via the rotator cuff tear. As
above, there is complex fluid along the bursal aspect of the
infraspinous muscle.

Glenohumeral Joint: Moderate-sized joint effusion. Mild glenohumeral
degenerative changes.

Labrum: Superior labral degeneration with adjacent subchondral cyst
formation in the glenoid. No labral tear or paralabral cyst
identified.

Bones: No significant extra-articular osseous findings. There is
intraosseous cyst formation posteriorly in the humeral head near the
infraspinous insertion.
IMPRESSION: 1. Recurrent full thickness insertional tears of the supraspinatus
and infraspinous tendons with mild tendon retraction. There is some
atrophy and irregular fluid within the infraspinous muscle, the
latter suggesting a possible acute exacerbation.
2. Superior labral degeneration with subchondral cyst formation
superiorly in the glenoid. No labral tear identified.
3. Intact biceps tendon.
4. Previous distal clavicle resection.

## 2016-09-20 ENCOUNTER — Encounter: Payer: Self-pay | Admitting: Gynecology

## 2016-09-20 ENCOUNTER — Ambulatory Visit (INDEPENDENT_AMBULATORY_CARE_PROVIDER_SITE_OTHER): Payer: Medicare Other | Admitting: Gynecology

## 2016-09-20 VITALS — BP 124/78

## 2016-09-20 DIAGNOSIS — N3000 Acute cystitis without hematuria: Secondary | ICD-10-CM | POA: Diagnosis not present

## 2016-09-20 DIAGNOSIS — B9689 Other specified bacterial agents as the cause of diseases classified elsewhere: Secondary | ICD-10-CM

## 2016-09-20 DIAGNOSIS — N760 Acute vaginitis: Secondary | ICD-10-CM

## 2016-09-20 LAB — URINALYSIS W MICROSCOPIC + REFLEX CULTURE
Bilirubin Urine: NEGATIVE
Casts: NONE SEEN [LPF]
Crystals: NONE SEEN [HPF]
GLUCOSE, UA: NEGATIVE
Ketones, ur: NEGATIVE
NITRITE: NEGATIVE
PH: 5.5 (ref 5.0–8.0)
Protein, ur: NEGATIVE
SPECIFIC GRAVITY, URINE: 1.02 (ref 1.001–1.035)
YEAST: NONE SEEN [HPF]

## 2016-09-20 LAB — WET PREP FOR TRICH, YEAST, CLUE
Trich, Wet Prep: NONE SEEN
Yeast Wet Prep HPF POC: NONE SEEN

## 2016-09-20 MED ORDER — METRONIDAZOLE 500 MG PO TABS
500.0000 mg | ORAL_TABLET | Freq: Two times a day (BID) | ORAL | 0 refills | Status: DC
Start: 2016-09-20 — End: 2016-10-08

## 2016-09-20 MED ORDER — NITROFURANTOIN MONOHYD MACRO 100 MG PO CAPS: 100.0000 mg | ORAL_CAPSULE | Freq: Two times a day (BID) | ORAL | 0 refills | Status: DC

## 2016-09-20 NOTE — Addendum Note (Signed)
Addended by: Nelva Nay on: 09/20/2016 03:59 PM   Modules accepted: Orders

## 2016-09-20 NOTE — Patient Instructions (Signed)
Take the Flagyl medication twice daily for 7 days  Take the Macrodantin antibiotic twice daily for 7 days

## 2016-09-20 NOTE — Progress Notes (Signed)
    Cynthia [name] 08-28-1950 [id number]        65 y.o.  [id number] presents with several months of vaginal irritation and discharge. No odor. Also notes some frequency yesterday although that seems better today. She has some mild discomfort with urination and some lower abdominal discomfort. Also notes a little bit of back pain. No fever or chills. Does have a history of diverticulitis in the past and was unsure whether it was related to this or not.  Past medical history,surgical history, problem list, medications, allergies, family history and social history were all reviewed and documented in the EPIC chart.  Directed ROS with pertinent positives and negatives documented in the history of present illness/assessment and plan.  Exam: Caryn Bee assistant Vitals:   09/20/16 1535  BP: 124/78   General appearance:  Normal Spine straight without CVA tenderness Abdomen soft nontender without masses guarding rebound Pelvic external BUS vagina with significant atrophic changes. Cervix atrophic. Uterus grossly normal midline mobile nontender. Adnexa without masses or tenderness.  Assessment/Plan:  66 y.o. G6P0060 with above history and exam. Urinalysis although does have some squamous cells appears infected with 40-60 WBC and moderate bacteria. Will cover with Macrobid 100 mg twice a day 7 days. Wet prep is consistent with bacterial vaginosis and I'm going to cover her with Flagyl 500 mg twice a day 7 days. Patient has an appointment to see me next month for her annual exam and will follow up with me at that point. Sooner if her symptoms persist or worsen.    Anastasio Auerbach MD, 3:46 PM 09/20/2016

## 2016-09-21 LAB — URINE CULTURE

## 2016-10-01 ENCOUNTER — Encounter: Payer: Medicare Other | Admitting: Gynecology

## 2016-10-01 DIAGNOSIS — H5203 Hypermetropia, bilateral: Secondary | ICD-10-CM | POA: Diagnosis not present

## 2016-10-01 DIAGNOSIS — H04123 Dry eye syndrome of bilateral lacrimal glands: Secondary | ICD-10-CM | POA: Diagnosis not present

## 2016-10-01 DIAGNOSIS — H2513 Age-related nuclear cataract, bilateral: Secondary | ICD-10-CM | POA: Diagnosis not present

## 2016-10-01 DIAGNOSIS — H524 Presbyopia: Secondary | ICD-10-CM | POA: Diagnosis not present

## 2016-10-08 ENCOUNTER — Encounter: Payer: Self-pay | Admitting: Gynecology

## 2016-10-08 ENCOUNTER — Ambulatory Visit (INDEPENDENT_AMBULATORY_CARE_PROVIDER_SITE_OTHER): Payer: Medicare Other | Admitting: Gynecology

## 2016-10-08 VITALS — BP 120/78 | Ht 61.5 in | Wt 143.0 lb

## 2016-10-08 DIAGNOSIS — R21 Rash and other nonspecific skin eruption: Secondary | ICD-10-CM

## 2016-10-08 DIAGNOSIS — Z01419 Encounter for gynecological examination (general) (routine) without abnormal findings: Secondary | ICD-10-CM | POA: Diagnosis not present

## 2016-10-08 DIAGNOSIS — Z01411 Encounter for gynecological examination (general) (routine) with abnormal findings: Secondary | ICD-10-CM

## 2016-10-08 DIAGNOSIS — Z124 Encounter for screening for malignant neoplasm of cervix: Secondary | ICD-10-CM

## 2016-10-08 DIAGNOSIS — N952 Postmenopausal atrophic vaginitis: Secondary | ICD-10-CM

## 2016-10-08 DIAGNOSIS — N898 Other specified noninflammatory disorders of vagina: Secondary | ICD-10-CM | POA: Diagnosis not present

## 2016-10-08 DIAGNOSIS — M8580 Other specified disorders of bone density and structure, unspecified site: Secondary | ICD-10-CM

## 2016-10-08 LAB — WET PREP FOR TRICH, YEAST, CLUE
Trich, Wet Prep: NONE SEEN
Yeast Wet Prep HPF POC: NONE SEEN

## 2016-10-08 NOTE — Patient Instructions (Addendum)
Follow up for bone density as scheduled Take the Flagyl medication twice daily for 7 days. Avoid alcohol while taking. Call if the rash on the lower legs continue.

## 2016-10-08 NOTE — Progress Notes (Signed)
    Cynthia [name] [birth year] [id number]        66 y.o.  G6P0060 for breast and pelvic exam. Patient notes a rash on both lower extremities after showering this morning. No other place on her body. Not pruritic. No new soaps or lotion. Patient also notes recent treatment for bacterial vaginosis 09/20/2016 with Flagyl. Vaginal discharge at that point much better but still lingering. No odor or itching.  Past medical history,surgical history, problem list, medications, allergies, family history and social history were all reviewed and documented as reviewed in the EPIC chart.  ROS:  Performed with pertinent positives and negatives included in the history, assessment and plan.   Additional significant findings :  None   Exam: Caryn Bee assistant Vitals:   10/08/16 1425  BP: 120/78  Weight: 143 lb (64.9 kg)  Height: 5' 1.5" (1.562 m)   Body mass index is 26.58 kg/m.  General appearance:  Normal affect, orientation and appearance. Skin: Grossly normal excepting erythematous generalized rash both lower extremities. HEENT: Without gross lesions.  No cervical or supraclavicular adenopathy. Thyroid normal.  Lungs:  Clear without wheezing, rales or rhonchi Cardiac: RR, without RMG Abdominal:  Soft, nontender, without masses, guarding, rebound, organomegaly or hernia Breasts:  Examined lying and sitting without masses, retractions, discharge or axillary adenopathy. Pelvic:  Ext, BUS, Vagina: With atrophic changes. Scant discharge noted. Wet prep done.  Cervix: Atrophic. Pap smear done  Uterus: Grossly normal size, midline and mobile nontender   Adnexa: Without masses or tenderness    Anus and perineum: Normal   Rectovaginal: Normal sphincter tone without palpated masses or tenderness.    Assessment/Plan:  66 y.o. G38P0060 female for breast and pelvic exam   1. Postmenopausal/atrophic genital changes.  No significant hot flushes, night sweats, vaginal dryness or any vaginal bleeding.     2. Lingering vaginal discharge.  Wet prep consistent with low-grade bacterial vaginosis. Options for treatment reviewed and patient elects for Flagyl. She actually has a prescription at home and will use that. Alcohol avoidance reviewed. 3. Rash both lower extremities. Questionable contact dermatitis. Recommended hydrocortisone cream twice daily. Follow up with dermatologist if continues. 4. Osteopenia. DEXA 04/22/2014 T score -1.7 FRAX 9%/1%. Recommend follow up DEXA now and patient will schedule. 5. Mammography 10/2015. Continue with annual mammography next month. SBE monthly reviewed. 6. Colonoscopy 2017. Repeat at their recommended interval. 7. Pap smear 2015. Smear done today.  No history of significant abnormal Pap smears previously. 8. Health maintenance. No routine lab work done as patient does this elsewhere. Follow up for bone density otherwise follow up in one year, sooner as needed.   Anastasio Auerbach MD, 2:46 PM 10/08/2016

## 2016-10-08 NOTE — Addendum Note (Signed)
Addended by: Nelva Nay on: 10/08/2016 03:12 PM   Modules accepted: Orders

## 2016-10-11 LAB — PAP IG (IMAGE GUIDED)

## 2016-10-25 ENCOUNTER — Ambulatory Visit (INDEPENDENT_AMBULATORY_CARE_PROVIDER_SITE_OTHER): Payer: Medicare Other

## 2016-10-25 ENCOUNTER — Other Ambulatory Visit: Payer: Self-pay | Admitting: Gynecology

## 2016-10-25 DIAGNOSIS — M8580 Other specified disorders of bone density and structure, unspecified site: Secondary | ICD-10-CM

## 2016-10-25 DIAGNOSIS — Z780 Asymptomatic menopausal state: Secondary | ICD-10-CM

## 2016-10-25 DIAGNOSIS — M8589 Other specified disorders of bone density and structure, multiple sites: Secondary | ICD-10-CM | POA: Diagnosis not present

## 2016-10-26 ENCOUNTER — Encounter: Payer: Self-pay | Admitting: Gynecology

## 2016-11-05 ENCOUNTER — Encounter: Payer: Self-pay | Admitting: Gynecology

## 2016-11-05 DIAGNOSIS — Z1231 Encounter for screening mammogram for malignant neoplasm of breast: Secondary | ICD-10-CM | POA: Diagnosis not present

## 2016-11-05 DIAGNOSIS — Z803 Family history of malignant neoplasm of breast: Secondary | ICD-10-CM | POA: Diagnosis not present

## 2016-11-26 DIAGNOSIS — M859 Disorder of bone density and structure, unspecified: Secondary | ICD-10-CM | POA: Diagnosis not present

## 2016-11-26 DIAGNOSIS — Z0000 Encounter for general adult medical examination without abnormal findings: Secondary | ICD-10-CM | POA: Diagnosis not present

## 2016-11-26 DIAGNOSIS — D649 Anemia, unspecified: Secondary | ICD-10-CM | POA: Diagnosis not present

## 2016-12-03 DIAGNOSIS — Z1389 Encounter for screening for other disorder: Secondary | ICD-10-CM | POA: Diagnosis not present

## 2016-12-03 DIAGNOSIS — G43909 Migraine, unspecified, not intractable, without status migrainosus: Secondary | ICD-10-CM | POA: Diagnosis not present

## 2016-12-03 DIAGNOSIS — R1310 Dysphagia, unspecified: Secondary | ICD-10-CM | POA: Diagnosis not present

## 2016-12-03 DIAGNOSIS — M5030 Other cervical disc degeneration, unspecified cervical region: Secondary | ICD-10-CM | POA: Diagnosis not present

## 2016-12-03 DIAGNOSIS — F985 Adult onset fluency disorder: Secondary | ICD-10-CM | POA: Diagnosis not present

## 2016-12-03 DIAGNOSIS — J3089 Other allergic rhinitis: Secondary | ICD-10-CM | POA: Diagnosis not present

## 2016-12-03 DIAGNOSIS — M859 Disorder of bone density and structure, unspecified: Secondary | ICD-10-CM | POA: Diagnosis not present

## 2016-12-03 DIAGNOSIS — Z0000 Encounter for general adult medical examination without abnormal findings: Secondary | ICD-10-CM | POA: Diagnosis not present

## 2016-12-03 DIAGNOSIS — N3281 Overactive bladder: Secondary | ICD-10-CM | POA: Diagnosis not present

## 2016-12-03 DIAGNOSIS — Z6825 Body mass index (BMI) 25.0-25.9, adult: Secondary | ICD-10-CM | POA: Diagnosis not present

## 2016-12-03 DIAGNOSIS — M25511 Pain in right shoulder: Secondary | ICD-10-CM | POA: Diagnosis not present

## 2016-12-03 DIAGNOSIS — M4310 Spondylolisthesis, site unspecified: Secondary | ICD-10-CM | POA: Diagnosis not present

## 2017-10-14 DIAGNOSIS — H524 Presbyopia: Secondary | ICD-10-CM | POA: Diagnosis not present

## 2017-10-14 DIAGNOSIS — H04123 Dry eye syndrome of bilateral lacrimal glands: Secondary | ICD-10-CM | POA: Diagnosis not present

## 2017-10-14 DIAGNOSIS — H5203 Hypermetropia, bilateral: Secondary | ICD-10-CM | POA: Diagnosis not present

## 2017-11-04 ENCOUNTER — Encounter: Payer: Self-pay | Admitting: Gynecology

## 2017-11-04 DIAGNOSIS — Z0189 Encounter for other specified special examinations: Secondary | ICD-10-CM | POA: Diagnosis not present

## 2017-11-04 DIAGNOSIS — Z1231 Encounter for screening mammogram for malignant neoplasm of breast: Secondary | ICD-10-CM | POA: Diagnosis not present

## 2017-11-08 ENCOUNTER — Encounter: Payer: Self-pay | Admitting: Gynecology

## 2017-12-09 DIAGNOSIS — M859 Disorder of bone density and structure, unspecified: Secondary | ICD-10-CM | POA: Diagnosis not present

## 2017-12-09 DIAGNOSIS — R7989 Other specified abnormal findings of blood chemistry: Secondary | ICD-10-CM | POA: Diagnosis not present

## 2017-12-11 ENCOUNTER — Encounter (HOSPITAL_COMMUNITY): Payer: Self-pay | Admitting: Emergency Medicine

## 2017-12-11 ENCOUNTER — Emergency Department (HOSPITAL_COMMUNITY): Payer: Medicare Other

## 2017-12-11 ENCOUNTER — Emergency Department (HOSPITAL_COMMUNITY)
Admission: EM | Admit: 2017-12-11 | Discharge: 2017-12-11 | Disposition: A | Discharge status: Home / Self Care | Payer: Medicare Other | Attending: Emergency Medicine | Admitting: Emergency Medicine

## 2017-12-11 DIAGNOSIS — Z87891 Personal history of nicotine dependence: Secondary | ICD-10-CM | POA: Insufficient documentation

## 2017-12-11 DIAGNOSIS — Z79899 Other long term (current) drug therapy: Secondary | ICD-10-CM | POA: Insufficient documentation

## 2017-12-11 DIAGNOSIS — R072 Precordial pain: Secondary | ICD-10-CM | POA: Diagnosis not present

## 2017-12-11 DIAGNOSIS — R112 Nausea with vomiting, unspecified: Secondary | ICD-10-CM | POA: Insufficient documentation

## 2017-12-11 DIAGNOSIS — R079 Chest pain, unspecified: Secondary | ICD-10-CM | POA: Insufficient documentation

## 2017-12-11 DIAGNOSIS — Z7982 Long term (current) use of aspirin: Secondary | ICD-10-CM | POA: Diagnosis not present

## 2017-12-11 DIAGNOSIS — R1010 Upper abdominal pain, unspecified: Secondary | ICD-10-CM | POA: Diagnosis present

## 2017-12-11 DIAGNOSIS — R1013 Epigastric pain: Secondary | ICD-10-CM

## 2017-12-11 LAB — HEPATIC FUNCTION PANEL
ALBUMIN: 4 g/dL (ref 3.5–5.0)
ALK PHOS: 49 U/L (ref 38–126)
ALT: 14 U/L (ref 0–44)
AST: 21 U/L (ref 15–41)
BILIRUBIN TOTAL: 0.5 mg/dL (ref 0.3–1.2)
Bilirubin, Direct: <0.1 mg/dL (ref 0.0–0.2)
TOTAL PROTEIN: 7.1 g/dL (ref 6.5–8.1)

## 2017-12-11 LAB — BASIC METABOLIC PANEL
ANION GAP: 10 (ref 5–15)
BUN: 10 mg/dL (ref 8–23)
CO2: 25 mmol/L (ref 22–32)
Calcium: 9.7 mg/dL (ref 8.9–10.3)
Chloride: 105 mmol/L (ref 98–111)
Creatinine, Ser: 0.79 mg/dL (ref 0.44–1.00)
Glucose, Bld: 121 mg/dL — ABNORMAL HIGH (ref 70–99)
POTASSIUM: 3.6 mmol/L (ref 3.5–5.1)
SODIUM: 140 mmol/L (ref 135–145)

## 2017-12-11 LAB — I-STAT TROPONIN, ED
TROPONIN I, POC: 0 ng/mL (ref 0.00–0.08)
Troponin i, poc: 0 ng/mL (ref 0.00–0.08)

## 2017-12-11 LAB — CBC
HCT: 41.9 % (ref 36.0–46.0)
HEMOGLOBIN: 14.1 g/dL (ref 12.0–15.0)
MCH: 30.7 pg (ref 26.0–34.0)
MCHC: 33.7 g/dL (ref 30.0–36.0)
MCV: 91.1 fL (ref 78.0–100.0)
Platelets: 294 10*3/uL (ref 150–400)
RBC: 4.6 MIL/uL (ref 3.87–5.11)
RDW: 12.3 % (ref 11.5–15.5)
WBC: 8.1 10*3/uL (ref 4.0–10.5)

## 2017-12-11 LAB — LIPASE, BLOOD: Lipase: 30 U/L (ref 11–51)

## 2017-12-11 MED ORDER — SUCRALFATE 1 GM/10ML PO SUSP: 1.0000 g | Freq: Three times a day (TID) | ORAL | 0 refills | Status: DC

## 2017-12-11 MED ORDER — PANTOPRAZOLE SODIUM 20 MG PO TBEC: 20.0000 mg | DELAYED_RELEASE_TABLET | Freq: Every day | ORAL | 0 refills | Status: DC

## 2017-12-11 MED ORDER — ONDANSETRON 4 MG PO TBDP
4.0000 mg | ORAL_TABLET | Freq: Once | ORAL | Status: AC | PRN
  Administered 2017-12-11: 4 mg via ORAL
  Filled 2017-12-11: qty 1

## 2017-12-11 MED ORDER — FAMOTIDINE IN NACL 20-0.9 MG/50ML-% IV SOLN
20.0000 mg | Freq: Once | INTRAVENOUS | Status: AC
  Administered 2017-12-11: 20 mg via INTRAVENOUS
  Filled 2017-12-11: qty 50

## 2017-12-11 MED ORDER — SUCRALFATE 1 G PO TABS
1.0000 g | ORAL_TABLET | Freq: Once | ORAL | Status: AC
Start: 2017-12-11 — End: 2017-12-11
  Administered 2017-12-11: 1 g via ORAL
  Filled 2017-12-11: qty 1

## 2017-12-11 NOTE — Discharge Instructions (Addendum)
You were seen in the emergency department for upper abdominal pain that went anterior chest and back with associated nausea and vomiting.  You had lab work EKG chest x-ray that did not show an obvious cause of your symptoms.  This is likely related to your stomach and could possibly be an ulcer.  We are prescribing you some acid medication and some medication to help coat the area to allow it to heal.  It will be important for you to follow-up with your doctor and possibly with gastroenterology.  Please return if any worsening symptoms

## 2017-12-11 NOTE — ED Triage Notes (Signed)
Reports epigastric burning that started yesterday morning around 5am that radiates into the back.  Also endorses chest pressure from the center of the chest over to right chest.  Also endorses n/v.

## 2017-12-11 NOTE — ED Notes (Signed)
ED Provider at bedside. 
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
## 2017-12-16 DIAGNOSIS — Z0000 Encounter for general adult medical examination without abnormal findings: Secondary | ICD-10-CM | POA: Diagnosis not present

## 2017-12-16 DIAGNOSIS — F985 Adult onset fluency disorder: Secondary | ICD-10-CM | POA: Diagnosis not present

## 2017-12-16 DIAGNOSIS — N3281 Overactive bladder: Secondary | ICD-10-CM | POA: Diagnosis not present

## 2017-12-16 DIAGNOSIS — M859 Disorder of bone density and structure, unspecified: Secondary | ICD-10-CM | POA: Diagnosis not present

## 2017-12-16 DIAGNOSIS — M25511 Pain in right shoulder: Secondary | ICD-10-CM | POA: Diagnosis not present

## 2017-12-16 DIAGNOSIS — Z1389 Encounter for screening for other disorder: Secondary | ICD-10-CM | POA: Diagnosis not present

## 2017-12-16 DIAGNOSIS — R600 Localized edema: Secondary | ICD-10-CM | POA: Diagnosis not present

## 2017-12-16 DIAGNOSIS — R1319 Other dysphagia: Secondary | ICD-10-CM | POA: Diagnosis not present

## 2017-12-16 DIAGNOSIS — Z6827 Body mass index (BMI) 27.0-27.9, adult: Secondary | ICD-10-CM | POA: Diagnosis not present

## 2017-12-16 DIAGNOSIS — J3089 Other allergic rhinitis: Secondary | ICD-10-CM | POA: Diagnosis not present

## 2018-10-02 DIAGNOSIS — L570 Actinic keratosis: Secondary | ICD-10-CM | POA: Diagnosis not present

## 2018-10-02 DIAGNOSIS — D485 Neoplasm of uncertain behavior of skin: Secondary | ICD-10-CM | POA: Diagnosis not present

## 2018-10-20 DIAGNOSIS — H5010 Unspecified exotropia: Secondary | ICD-10-CM | POA: Diagnosis not present

## 2018-10-20 DIAGNOSIS — H5203 Hypermetropia, bilateral: Secondary | ICD-10-CM | POA: Diagnosis not present

## 2018-10-20 DIAGNOSIS — H2513 Age-related nuclear cataract, bilateral: Secondary | ICD-10-CM | POA: Diagnosis not present

## 2018-10-20 DIAGNOSIS — H524 Presbyopia: Secondary | ICD-10-CM | POA: Diagnosis not present

## 2018-12-15 DIAGNOSIS — M859 Disorder of bone density and structure, unspecified: Secondary | ICD-10-CM | POA: Diagnosis not present

## 2018-12-15 DIAGNOSIS — G43909 Migraine, unspecified, not intractable, without status migrainosus: Secondary | ICD-10-CM | POA: Diagnosis not present

## 2018-12-15 DIAGNOSIS — Z79899 Other long term (current) drug therapy: Secondary | ICD-10-CM | POA: Diagnosis not present

## 2018-12-15 DIAGNOSIS — D649 Anemia, unspecified: Secondary | ICD-10-CM | POA: Diagnosis not present

## 2018-12-22 DIAGNOSIS — F985 Adult onset fluency disorder: Secondary | ICD-10-CM | POA: Diagnosis not present

## 2018-12-22 DIAGNOSIS — Z1331 Encounter for screening for depression: Secondary | ICD-10-CM | POA: Diagnosis not present

## 2018-12-22 DIAGNOSIS — N3281 Overactive bladder: Secondary | ICD-10-CM | POA: Diagnosis not present

## 2018-12-22 DIAGNOSIS — Z0000 Encounter for general adult medical examination without abnormal findings: Secondary | ICD-10-CM | POA: Diagnosis not present

## 2018-12-22 DIAGNOSIS — M1990 Unspecified osteoarthritis, unspecified site: Secondary | ICD-10-CM | POA: Diagnosis not present

## 2018-12-22 DIAGNOSIS — G43909 Migraine, unspecified, not intractable, without status migrainosus: Secondary | ICD-10-CM | POA: Diagnosis not present

## 2018-12-22 DIAGNOSIS — J309 Allergic rhinitis, unspecified: Secondary | ICD-10-CM | POA: Diagnosis not present

## 2018-12-22 DIAGNOSIS — R600 Localized edema: Secondary | ICD-10-CM | POA: Diagnosis not present

## 2018-12-22 DIAGNOSIS — R42 Dizziness and giddiness: Secondary | ICD-10-CM | POA: Diagnosis not present

## 2018-12-22 DIAGNOSIS — M8580 Other specified disorders of bone density and structure, unspecified site: Secondary | ICD-10-CM | POA: Diagnosis not present

## 2018-12-28 ENCOUNTER — Other Ambulatory Visit: Payer: Self-pay

## 2018-12-29 ENCOUNTER — Ambulatory Visit (INDEPENDENT_AMBULATORY_CARE_PROVIDER_SITE_OTHER): Payer: Medicare Other | Admitting: Gynecology

## 2018-12-29 ENCOUNTER — Encounter (INDEPENDENT_AMBULATORY_CARE_PROVIDER_SITE_OTHER): Payer: Self-pay

## 2018-12-29 ENCOUNTER — Encounter: Payer: Self-pay | Admitting: Gynecology

## 2018-12-29 VITALS — BP 116/70 | Ht 61.0 in | Wt 108.0 lb

## 2018-12-29 DIAGNOSIS — Z01419 Encounter for gynecological examination (general) (routine) without abnormal findings: Secondary | ICD-10-CM

## 2018-12-29 DIAGNOSIS — Z803 Family history of malignant neoplasm of breast: Secondary | ICD-10-CM | POA: Diagnosis not present

## 2018-12-29 DIAGNOSIS — N952 Postmenopausal atrophic vaginitis: Secondary | ICD-10-CM

## 2018-12-29 DIAGNOSIS — M8580 Other specified disorders of bone density and structure, unspecified site: Secondary | ICD-10-CM

## 2018-12-29 DIAGNOSIS — Z1231 Encounter for screening mammogram for malignant neoplasm of breast: Secondary | ICD-10-CM | POA: Diagnosis not present

## 2018-12-29 NOTE — Patient Instructions (Signed)
Follow-up for the bone density as scheduled. 

## 2018-12-29 NOTE — Progress Notes (Signed)
    JOELLE ROSWELL 1950/07/12 [id number]        68 y.o.  G6P0060 for breast and pelvic exam.  Without gynecologic complaints  Past medical history,surgical history, problem list, medications, allergies, family history and social history were all reviewed and documented as reviewed in the EPIC chart.  ROS:  Performed with pertinent positives and negatives included in the history, assessment and plan.   Additional significant findings : None   Exam: Caryn Bee assistant Vitals:   12/29/18 1153  BP: 116/70  Weight: 108 lb (49 kg)  Height: 5\' 1"  (1.549 m)   Body mass index is 20.41 kg/m.  General appearance:  Normal affect, orientation and appearance. Skin: Grossly normal HEENT: Without gross lesions.  No cervical or supraclavicular adenopathy. Thyroid normal.  Lungs:  Clear without wheezing, rales or rhonchi Cardiac: RR, without RMG Abdominal:  Soft, nontender, without masses, guarding, rebound, organomegaly or hernia Breasts:  Examined lying and sitting without masses, retractions, discharge or axillary adenopathy. Pelvic:  Ext, BUS, Vagina: With atrophic changes  Cervix: With atrophic changes  Uterus: Anteverted, normal size, shape and contour, midline and mobile nontender   Adnexa: Without masses or tenderness    Anus and perineum: Normal   Rectovaginal: Normal sphincter tone without palpated masses or tenderness.    Assessment/Plan:  68 y.o. G32P0060 female for breast and pelvic exam  1. Postmenopausal.  No significant menopausal symptoms or any vaginal bleeding. 2. Osteopenia.  DEXA 2018 T score -1.7 FRAX 10% / 1.5%.  Recommend follow-up DEXA now and she will schedule in follow-up for this. 3. Mammography today.  Breast exam normal today. 4. Colonoscopy 2017.  Repeat at their recommended interval. 5. Pap smear 2018.  No Pap smear done today.  No history of significant abnormal Pap smears.  Options to stop screening per current screening guidelines versus less frequent  screening intervals reviewed.  Will readdress on annual basis. 6. Health maintenance.  No routine lab work done as patient does this elsewhere.  Follow-up 1 year, sooner as needed.   Anastasio Auerbach MD, 12:34 PM 12/29/2018

## 2019-01-08 ENCOUNTER — Other Ambulatory Visit: Payer: Self-pay

## 2019-01-09 ENCOUNTER — Other Ambulatory Visit: Payer: Self-pay | Admitting: Gynecology

## 2019-01-09 ENCOUNTER — Ambulatory Visit (INDEPENDENT_AMBULATORY_CARE_PROVIDER_SITE_OTHER): Payer: Medicare Other

## 2019-01-09 DIAGNOSIS — M8589 Other specified disorders of bone density and structure, multiple sites: Secondary | ICD-10-CM | POA: Diagnosis not present

## 2019-01-09 DIAGNOSIS — Z780 Asymptomatic menopausal state: Secondary | ICD-10-CM

## 2019-01-09 DIAGNOSIS — M8580 Other specified disorders of bone density and structure, unspecified site: Secondary | ICD-10-CM

## 2019-01-10 ENCOUNTER — Encounter: Payer: Self-pay | Admitting: Gynecology

## 2019-02-28 ENCOUNTER — Encounter: Payer: Self-pay | Admitting: Gynecology

## 2019-05-08 DIAGNOSIS — M25511 Pain in right shoulder: Secondary | ICD-10-CM | POA: Diagnosis not present

## 2019-05-11 ENCOUNTER — Other Ambulatory Visit: Payer: Medicare Other

## 2019-06-06 DIAGNOSIS — M5030 Other cervical disc degeneration, unspecified cervical region: Secondary | ICD-10-CM | POA: Diagnosis not present

## 2019-06-06 DIAGNOSIS — M25511 Pain in right shoulder: Secondary | ICD-10-CM | POA: Diagnosis not present

## 2019-06-12 DIAGNOSIS — H5310 Unspecified subjective visual disturbances: Secondary | ICD-10-CM | POA: Diagnosis not present

## 2019-06-12 DIAGNOSIS — H43811 Vitreous degeneration, right eye: Secondary | ICD-10-CM | POA: Diagnosis not present

## 2019-07-22 ENCOUNTER — Ambulatory Visit: Payer: PPO | Attending: Internal Medicine

## 2019-07-22 DIAGNOSIS — Z23 Encounter for immunization: Secondary | ICD-10-CM | POA: Insufficient documentation

## 2019-07-22 NOTE — Progress Notes (Signed)
   Covid-19 Vaccination Clinic  Name:  ANAYRA STALLING    MRN: [id number] DOB: 05-10-1951  07/22/2019  Ms. Brull was observed post Covid-19 immunization for 15 minutes without incidence. She was provided with Vaccine Information Sheet and instruction to access the V-Safe system.   Ms. Baert was instructed to call 911 with any severe reactions post vaccine: Marland Kitchen Difficulty breathing  . Swelling of your face and throat  . A fast heartbeat  . A bad rash all over your body  . Dizziness and weakness    Immunizations Administered    Name Date Dose VIS Date Route   Pfizer COVID-19 Vaccine 07/22/2019  9:13 AM 0.3 mL 05/04/2019 Intramuscular   Manufacturer: [place]   Lot: [id number]   [place]: [id number]

## 2019-08-15 ENCOUNTER — Ambulatory Visit: Payer: PPO | Attending: Internal Medicine

## 2019-08-15 DIAGNOSIS — Z23 Encounter for immunization: Secondary | ICD-10-CM

## 2019-08-15 NOTE — Progress Notes (Signed)
   Covid-19 Vaccination Clinic  Name:  Cynthia [name]    MRN: [id number] DOB: 1951/03/11  08/15/2019  Cynthia [name] was observed post Covid-19 immunization for 15 minutes without incident. She was provided with Vaccine Information Sheet and instruction to access the V-Safe system.   Cynthia [name] was instructed to call 911 with any severe reactions post vaccine: Marland Kitchen Difficulty breathing  . Swelling of face and throat  . A fast heartbeat  . A bad rash all over body  . Dizziness and weakness   Immunizations Administered    Name Date Dose VIS Date Route   Pfizer COVID-19 Vaccine 08/15/2019 11:27 AM 0.3 mL 05/04/2019 Intramuscular   Manufacturer: [place]   Lot: [id number]   Center: [id number]

## 2019-10-19 ENCOUNTER — Encounter (HOSPITAL_COMMUNITY): Payer: Self-pay

## 2019-10-19 NOTE — Patient Instructions (Addendum)
DUE TO COVID-19 ONLY ONE VISITOR ARE ALLOWED TO COME WITH YOU AND STAY IN THE WAITING ROOM ONLY DURING PRE OP AND PROCEDURE. THEN TWO VISITORS MAY VISIT WITH YOU IN YOUR PRIVATE ROOM DURING VISITING HOURS ONLY!!   COVID SWAB TESTING MUST BE COMPLETED ON:  Monday, October 29, 2019 at  10:45 AM [address] [place] -[facility] enter pre surgical testing line (Must self quarantine after testing. Follow instructions on handout.)             Your procedure is scheduled on: Thursday, November 01, 2019   Report to [facility] Main  Entrance   Report to Short Stay at 5:30 AM   [facility])   Call this number if you have problems the morning of surgery [phone]   Do not eat food :After Midnight.   May have liquids until 4:30 AM day of surgery   CLEAR LIQUID DIET  Foods Allowed                                                                     Foods Excluded  Water, Black Coffee and tea, regular and decaf                             liquids that you cannot  Plain Jell-O in any flavor  (No red)                                           see through such as: Fruit ices (not with fruit pulp)                                     milk, soups, orange juice  Iced Popsicles (No red)                                    All solid food Carbonated beverages, regular and diet                                    Apple juices Sports drinks like Gatorade (No red) Lightly seasoned clear broth or consume(fat free) Sugar, honey syrup  Sample Menu Breakfast                                Lunch                                     Supper Cranberry juice                    Beef broth  Chicken broth Jell-O                                     Grape juice                           Apple juice Coffee or tea                        Jell-O                                      Popsicle                                                Coffee or tea                         Coffee or tea   Complete one Ensure drink the morning of surgery at 4:30 AM the day of surgery.   Oral Hygiene is also important to reduce your risk of infection.                                    Remember - BRUSH YOUR TEETH THE MORNING OF SURGERY WITH YOUR REGULAR TOOTHPASTE   Do NOT smoke after Midnight   Take these medicines the morning of surgery with A SIP OF WATER: NONE                               You may not have any metal on your body including hair pins, jewelry, and body piercings             Do not wear make-up, lotions, powders, perfumes/cologne, or deodorant             Do not wear nail polish.  Do not shave  48 hours prior to surgery.                Do not bring valuables to the hospital. [place].   Contacts, dentures or bridgework may not be worn into surgery.   Bring small overnight bag day of surgery.    Patients discharged the day of surgery will not be allowed to drive home.   Special Instructions: Bring a copy of your healthcare power of attorney and living will documents         the day of surgery if you haven't scanned them in before.              Please read over the following fact sheets you were given: IF YOU HAVE QUESTIONS ABOUT YOUR PRE OP INSTRUCTIONS PLEASE CALL [place]- Preparing for Total Shoulder Arthroplasty    Before surgery, you can play an important role. Because skin is not sterile, your skin needs to be as free of germs as possible. You can reduce the number of germs on  your skin by using the following products. . Benzoyl Peroxide Gel o Reduces the number of germs present on the skin o Applied twice a day to shoulder area starting two days before surgery    ==================================================================  Please follow these instructions carefully:  BENZOYL PEROXIDE 5% GEL  Please do not use if you have an allergy to benzoyl peroxide.   If  your skin becomes reddened/irritated stop using the benzoyl peroxide.  Starting two days before surgery, apply as follows: (Tuesday and Wednesday) 1. Apply benzoyl peroxide in the morning and at night. Apply after taking a shower. If you are not taking a shower clean entire shoulder front, back, and side along with the armpit with a clean wet washcloth.  2. Place a quarter-sized dollop on your shoulder and rub in thoroughly, making sure to cover the front, back, and side of your shoulder, along with the armpit.   2 days before ____ AM   ____ PM              1 day before ____ AM   ____ PM                         3. Do this twice a day for two days.  (Last application is the night before surgery, AFTER using the CHG soap as described below).  4. Do NOT apply benzoyl peroxide gel on the day of surgery. [facility] - Preparing for Surgery Before surgery, you can play an important role.  Because skin is not sterile, your skin needs to be as free of germs as possible.  You can reduce the number of germs on your skin by washing with CHG (chlorahexidine gluconate) soap before surgery.  CHG is an antiseptic cleaner which kills germs and bonds with the skin to continue killing germs even after washing. Please DO NOT use if you have an allergy to CHG or antibacterial soaps.  If your skin becomes reddened/irritated stop using the CHG and inform your nurse when you arrive at Short Stay. Do not shave (including legs and underarms) for at least 48 hours prior to the first CHG shower.  You may shave your face/neck.  Please follow these instructions carefully:  1.  Shower with CHG Soap the night before surgery and the  morning of surgery.  2.  If you choose to wash your hair, wash your hair first as usual with your normal  shampoo.  3.  After you shampoo, rinse your hair and body thoroughly to remove the shampoo.                             4.  Use CHG as you would any other liquid soap.  You can apply chg  directly to the skin and wash.  Gently with a scrungie or clean washcloth.  5.  Apply the CHG Soap to your body ONLY FROM THE NECK DOWN.   Do   not use on face/ open                           Wound or open sores. Avoid contact with eyes, ears mouth and   genitals (private parts).                       Wash face,  Genitals (private parts) with your normal soap.  6.  Wash thoroughly, paying special attention to the area where your    surgery  will be performed.  7.  Thoroughly rinse your body with warm water from the neck down.  8.  DO NOT shower/wash with your normal soap after using and rinsing off the CHG Soap.                9.  Pat yourself dry with a clean towel.            10.  Wear clean pajamas.            11.  Place clean sheets on your bed the night of your first shower and do not  sleep with pets. Day of Surgery : Do not apply any lotions/deodorants the morning of surgery.  Please wear clean clothes to the hospital/surgery center.  FAILURE TO FOLLOW THESE INSTRUCTIONS MAY RESULT IN THE CANCELLATION OF YOUR SURGERY  PATIENT SIGNATURE_________________________________  NURSE SIGNATURE__________________________________  ________________________________________________________________________   Adam Phenix  An incentive spirometer is a tool that can help keep your lungs clear and active. This tool measures how well you are filling your lungs with each breath. Taking long deep breaths may help reverse or decrease the chance of developing breathing (pulmonary) problems (especially infection) following:  A long period of time when you are unable to move or be active. BEFORE THE PROCEDURE   If the spirometer includes an indicator to show your best effort, your nurse or respiratory therapist will set it to a desired goal.  If possible, sit up straight or lean slightly forward. Try not to slouch.  Hold the incentive spirometer in an upright position. INSTRUCTIONS  FOR USE  1. Sit on the edge of your bed if possible, or sit up as far as you can in bed or on a chair. 2. Hold the incentive spirometer in an upright position. 3. Breathe out normally. 4. Place the mouthpiece in your mouth and seal your lips tightly around it. 5. Breathe in slowly and as deeply as possible, raising the piston or the ball toward the top of the column. 6. Hold your breath for 3-5 seconds or for as long as possible. Allow the piston or ball to fall to the bottom of the column. 7. Remove the mouthpiece from your mouth and breathe out normally. 8. Rest for a few seconds and repeat Steps 1 through 7 at least 10 times every 1-2 hours when you are awake. Take your time and take a few normal breaths between deep breaths. 9. The spirometer may include an indicator to show your best effort. Use the indicator as a goal to work toward during each repetition. 10. After each set of 10 deep breaths, practice coughing to be sure your lungs are clear. If you have an incision (the cut made at the time of surgery), support your incision when coughing by placing a pillow or rolled up towels firmly against it. Once you are able to get out of bed, walk around indoors and cough well. You may stop using the incentive spirometer when instructed by your caregiver.  RISKS AND COMPLICATIONS  Take your time so you do not get dizzy or light-headed.  If you are in pain, you may need to take or ask for pain medication before doing incentive spirometry. It is harder to take a deep breath if you are having pain. AFTER USE  Rest and breathe slowly and easily.  It can be helpful to keep track of a  log of your progress. Your caregiver can provide you with a simple table to help with this. If you are using the spirometer at home, follow these instructions: [place] IF:   You are having difficultly using the spirometer.  You have trouble using the spirometer as often as instructed.  Your pain  medication is not giving enough relief while using the spirometer.  You develop fever of 100.5 F (38.1 C) or higher. SEEK IMMEDIATE MEDICAL CARE IF:   You cough up bloody sputum that had not been present before.  You develop fever of 102 F (38.9 C) or greater.  You develop worsening pain at or near the incision site. MAKE SURE YOU:   Understand these instructions.  Will watch your condition.  Will get help right away if you are not doing well or get worse. Document Released: 09/20/2006 Document Revised: 08/02/2011 Document Reviewed: 11/21/2006 [facility] Patient Information 2014 [place], [place].   ________________________________________________________________________

## 2019-10-23 ENCOUNTER — Other Ambulatory Visit: Payer: Self-pay

## 2019-10-23 ENCOUNTER — Encounter (HOSPITAL_COMMUNITY)
Admission: RE | Admit: 2019-10-23 | Discharge: 2019-10-23 | Disposition: A | Discharge status: Home / Self Care | Payer: PPO | Source: Ambulatory Visit | Attending: Orthopedic Surgery | Admitting: Orthopedic Surgery

## 2019-10-23 ENCOUNTER — Encounter (HOSPITAL_COMMUNITY): Payer: Self-pay

## 2019-10-23 DIAGNOSIS — Z01812 Encounter for preprocedural laboratory examination: Secondary | ICD-10-CM | POA: Diagnosis not present

## 2019-10-23 HISTORY — DX: Pneumonia, unspecified organism: J18.9

## 2019-10-23 HISTORY — DX: Personal history of other diseases of the nervous system and sense organs: Z86.69

## 2019-10-23 HISTORY — DX: Unspecified osteoarthritis, unspecified site: M19.90

## 2019-10-23 HISTORY — DX: Carpal tunnel syndrome, left upper limb: G56.02

## 2019-10-23 HISTORY — DX: Mononeuropathy, unspecified: G58.9

## 2019-10-23 LAB — CBC
HCT: 40.2 % (ref 36.0–46.0)
Hemoglobin: 13.3 g/dL (ref 12.0–15.0)
MCH: 31.6 pg (ref 26.0–34.0)
MCHC: 33.1 g/dL (ref 30.0–36.0)
MCV: 95.5 fL (ref 80.0–100.0)
Platelets: 249 10*3/uL (ref 150–400)
RBC: 4.21 MIL/uL (ref 3.87–5.11)
RDW: 12.8 % (ref 11.5–15.5)
WBC: 5.4 10*3/uL (ref 4.0–10.5)
nRBC: 0 % (ref 0.0–0.2)

## 2019-10-23 LAB — SURGICAL PCR SCREEN
MRSA, PCR: NEGATIVE
Staphylococcus aureus: NEGATIVE

## 2019-10-23 NOTE — Progress Notes (Signed)
COVID Vaccine Completed: Yes Date COVID Vaccine completed: 08/15/19 COVID vaccine manufacturer: Pfizer     PCP - Dr. Aviva Signs Cardiologist - N/A  Chest x-ray - greater than 1 year EKG - greater than 1 year Stress Test - N/A ECHO - N/A Cardiac Cath - N/A  Sleep Study - N/A CPAP - N/A  Fasting Blood Sugar - N/A Checks Blood Sugar __N/A___ times a day  Blood Thinner Instructions: N/A Aspirin Instructions: No Last Dose: Instructed to contact Dr. Susie Cassette office for instructions  Anesthesia review: N/A  Patient denies shortness of breath, fever, cough and chest pain at PAT appointment   Patient verbalized understanding of instructions that were given to them at the PAT appointment. Patient was also instructed that they will need to review over the PAT instructions again at home before surgery.

## 2019-10-26 DIAGNOSIS — H04123 Dry eye syndrome of bilateral lacrimal glands: Secondary | ICD-10-CM | POA: Diagnosis not present

## 2019-10-26 DIAGNOSIS — H5203 Hypermetropia, bilateral: Secondary | ICD-10-CM | POA: Diagnosis not present

## 2019-10-26 DIAGNOSIS — G43909 Migraine, unspecified, not intractable, without status migrainosus: Secondary | ICD-10-CM | POA: Diagnosis not present

## 2019-10-26 DIAGNOSIS — H2513 Age-related nuclear cataract, bilateral: Secondary | ICD-10-CM | POA: Diagnosis not present

## 2019-10-29 ENCOUNTER — Other Ambulatory Visit (HOSPITAL_COMMUNITY)
Admission: RE | Admit: 2019-10-29 | Discharge: 2019-10-29 | Disposition: A | Discharge status: Home / Self Care | Payer: PPO | Source: Ambulatory Visit | Attending: Orthopedic Surgery | Admitting: Orthopedic Surgery

## 2019-10-29 DIAGNOSIS — Z20822 Contact with and (suspected) exposure to covid-19: Secondary | ICD-10-CM | POA: Diagnosis not present

## 2019-10-29 DIAGNOSIS — Z01812 Encounter for preprocedural laboratory examination: Secondary | ICD-10-CM | POA: Insufficient documentation

## 2019-10-29 LAB — SARS CORONAVIRUS 2 (TAT 6-24 HRS): SARS Coronavirus 2: NEGATIVE

## 2019-10-31 ENCOUNTER — Encounter (HOSPITAL_COMMUNITY): Payer: Self-pay | Admitting: Orthopedic Surgery

## 2019-10-31 NOTE — Anesthesia Preprocedure Evaluation (Addendum)
Anesthesia Evaluation  Patient identified by MRN, date of birth, ID band Patient awake    Reviewed: Allergy & Precautions, H&P , NPO status , Patient's Chart, lab work & pertinent test results  History of Anesthesia Complications Negative for: history of anesthetic complications  Airway Mallampati: II  TM Distance: >3 FB Neck ROM: Full    Dental no notable dental hx. (+) Dental Advisory Given, Teeth Intact   Pulmonary asthma , former smoker,    Pulmonary exam normal breath sounds clear to auscultation       Cardiovascular negative cardio ROS Normal cardiovascular exam Rhythm:Regular Rate:Normal     Neuro/Psych  Headaches,  Neuromuscular disease negative psych ROS   GI/Hepatic negative GI ROS, Neg liver ROS,   Endo/Other  negative endocrine ROS  Renal/GU negative Renal ROS     Musculoskeletal  (+) Arthritis ,   Abdominal   Peds  Hematology negative hematology ROS (+)   Anesthesia Other Findings   Reproductive/Obstetrics                            Anesthesia Physical  Anesthesia Plan  ASA: II  Anesthesia Plan: General   Post-op Pain Management: GA combined w/ Regional for post-op pain   Induction: Intravenous  PONV Risk Score and Plan: 3 and Ondansetron, Dexamethasone and Treatment may vary due to age or medical condition  Airway Management Planned: Oral ETT  Additional Equipment:   Intra-op Plan:   Post-operative Plan: Extubation in OR  Informed Consent:   Plan Discussed with:   Anesthesia Plan Comments:         Anesthesia Quick Evaluation

## 2019-11-01 ENCOUNTER — Ambulatory Visit (HOSPITAL_COMMUNITY): Payer: PPO | Admitting: Anesthesiology

## 2019-11-01 ENCOUNTER — Encounter (HOSPITAL_COMMUNITY): Payer: Self-pay | Admitting: Orthopedic Surgery

## 2019-11-01 ENCOUNTER — Ambulatory Visit (HOSPITAL_COMMUNITY)
Admission: RE | Admit: 2019-11-01 | Discharge: 2019-11-01 | Disposition: A | Discharge status: Home / Self Care | Payer: PPO | Attending: Orthopedic Surgery | Admitting: Orthopedic Surgery

## 2019-11-01 ENCOUNTER — Encounter (HOSPITAL_COMMUNITY)
Admission: RE | Disposition: A | Discharge status: Home / Self Care | Payer: Self-pay | Source: Home / Self Care | Attending: Orthopedic Surgery

## 2019-11-01 ENCOUNTER — Other Ambulatory Visit: Payer: Self-pay

## 2019-11-01 DIAGNOSIS — M13811 Other specified arthritis, right shoulder: Secondary | ICD-10-CM | POA: Diagnosis not present

## 2019-11-01 DIAGNOSIS — M8589 Other specified disorders of bone density and structure, multiple sites: Secondary | ICD-10-CM | POA: Insufficient documentation

## 2019-11-01 DIAGNOSIS — M7541 Impingement syndrome of right shoulder: Secondary | ICD-10-CM | POA: Diagnosis not present

## 2019-11-01 DIAGNOSIS — Z79899 Other long term (current) drug therapy: Secondary | ICD-10-CM | POA: Diagnosis not present

## 2019-11-01 DIAGNOSIS — Z7982 Long term (current) use of aspirin: Secondary | ICD-10-CM | POA: Insufficient documentation

## 2019-11-01 DIAGNOSIS — M25811 Other specified joint disorders, right shoulder: Secondary | ICD-10-CM | POA: Diagnosis not present

## 2019-11-01 DIAGNOSIS — Z87891 Personal history of nicotine dependence: Secondary | ICD-10-CM | POA: Diagnosis not present

## 2019-11-01 DIAGNOSIS — M75101 Unspecified rotator cuff tear or rupture of right shoulder, not specified as traumatic: Secondary | ICD-10-CM | POA: Insufficient documentation

## 2019-11-01 DIAGNOSIS — M1990 Unspecified osteoarthritis, unspecified site: Secondary | ICD-10-CM | POA: Insufficient documentation

## 2019-11-01 DIAGNOSIS — G8918 Other acute postprocedural pain: Secondary | ICD-10-CM | POA: Diagnosis not present

## 2019-11-01 HISTORY — PX: REVERSE SHOULDER ARTHROPLASTY: SHX5054

## 2019-11-01 SURGERY — ARTHROPLASTY, SHOULDER, TOTAL, REVERSE
Anesthesia: General | Site: Shoulder | Laterality: Right

## 2019-11-01 MED ORDER — PROPOFOL 10 MG/ML IV BOLUS
INTRAVENOUS | Status: AC
  Filled 2019-11-01: qty 40

## 2019-11-01 MED ORDER — CYCLOBENZAPRINE HCL 10 MG PO TABS
10.0000 mg | ORAL_TABLET | Freq: Three times a day (TID) | ORAL | 1 refills | Status: DC | PRN
Start: 2019-11-01 — End: 2020-01-02

## 2019-11-01 MED ORDER — TRANEXAMIC ACID-NACL 1000-0.7 MG/100ML-% IV SOLN
1000.0000 mg | INTRAVENOUS | Status: AC
  Administered 2019-11-01: 1000 mg via INTRAVENOUS
  Filled 2019-11-01: qty 100

## 2019-11-01 MED ORDER — FENTANYL CITRATE (PF) 100 MCG/2ML IJ SOLN
INTRAMUSCULAR | Status: AC
  Filled 2019-11-01: qty 2

## 2019-11-01 MED ORDER — ONDANSETRON HCL 4 MG/2ML IJ SOLN: 4.0000 mg | Freq: Four times a day (QID) | INTRAMUSCULAR | Status: DC | PRN

## 2019-11-01 MED ORDER — NAPROXEN 500 MG PO TABS
500.0000 mg | ORAL_TABLET | Freq: Two times a day (BID) | ORAL | 1 refills | Status: DC
Start: 2019-11-01 — End: 2020-01-02

## 2019-11-01 MED ORDER — VANCOMYCIN HCL 1000 MG IV SOLR
INTRAVENOUS | Status: AC
  Filled 2019-11-01: qty 1000

## 2019-11-01 MED ORDER — PHENYLEPHRINE HCL-NACL 10-0.9 MG/250ML-% IV SOLN
INTRAVENOUS | Status: DC | PRN
  Administered 2019-11-01: 50 ug/min via INTRAVENOUS

## 2019-11-01 MED ORDER — LIDOCAINE 2% (20 MG/ML) 5 ML SYRINGE
INTRAMUSCULAR | Status: AC
  Filled 2019-11-01: qty 10

## 2019-11-01 MED ORDER — LACTATED RINGERS IV BOLUS
500.0000 mL | Freq: Once | INTRAVENOUS | Status: AC
  Administered 2019-11-01: 500 mL via INTRAVENOUS

## 2019-11-01 MED ORDER — LACTATED RINGERS IV BOLUS: 250.0000 mL | Freq: Once | INTRAVENOUS | Status: DC

## 2019-11-01 MED ORDER — CHLORHEXIDINE GLUCONATE 0.12 % MT SOLN
15.0000 mL | Freq: Once | OROMUCOSAL | Status: AC
  Administered 2019-11-01: 15 mL via OROMUCOSAL

## 2019-11-01 MED ORDER — DEXAMETHASONE SODIUM PHOSPHATE 10 MG/ML IJ SOLN
INTRAMUSCULAR | Status: AC
  Filled 2019-11-01: qty 2

## 2019-11-01 MED ORDER — SUGAMMADEX SODIUM 200 MG/2ML IV SOLN
INTRAVENOUS | Status: DC | PRN
  Administered 2019-11-01: 200 mg via INTRAVENOUS

## 2019-11-01 MED ORDER — LIDOCAINE 2% (20 MG/ML) 5 ML SYRINGE
INTRAMUSCULAR | Status: DC | PRN
  Administered 2019-11-01: 40 mg via INTRAVENOUS

## 2019-11-01 MED ORDER — OXYCODONE-ACETAMINOPHEN 5-325 MG PO TABS: 1.0000 | ORAL_TABLET | ORAL | 0 refills | Status: DC | PRN

## 2019-11-01 MED ORDER — ROCURONIUM BROMIDE 10 MG/ML (PF) SYRINGE
PREFILLED_SYRINGE | INTRAVENOUS | Status: DC | PRN
  Administered 2019-11-01: 30 mg via INTRAVENOUS
  Administered 2019-11-01 (×2): 10 mg via INTRAVENOUS

## 2019-11-01 MED ORDER — ROCURONIUM BROMIDE 10 MG/ML (PF) SYRINGE
PREFILLED_SYRINGE | INTRAVENOUS | Status: AC
  Filled 2019-11-01: qty 30

## 2019-11-01 MED ORDER — LACTATED RINGERS IV SOLN: INTRAVENOUS | Status: DC

## 2019-11-01 MED ORDER — STERILE WATER FOR IRRIGATION IR SOLN
Status: DC | PRN
  Administered 2019-11-01: 2000 mL

## 2019-11-01 MED ORDER — MEPERIDINE HCL 50 MG/ML IJ SOLN: 6.2500 mg | INTRAMUSCULAR | Status: DC | PRN

## 2019-11-01 MED ORDER — METOCLOPRAMIDE HCL 5 MG PO TABS
5.0000 mg | ORAL_TABLET | Freq: Three times a day (TID) | ORAL | Status: DC | PRN
  Filled 2019-11-01: qty 2

## 2019-11-01 MED ORDER — ONDANSETRON HCL 4 MG/2ML IJ SOLN
INTRAMUSCULAR | Status: DC | PRN
  Administered 2019-11-01: 4 mg via INTRAVENOUS

## 2019-11-01 MED ORDER — ORAL CARE MOUTH RINSE: 15.0000 mL | Freq: Once | OROMUCOSAL | Status: AC

## 2019-11-01 MED ORDER — CEFAZOLIN SODIUM-DEXTROSE 2-4 GM/100ML-% IV SOLN
2.0000 g | INTRAVENOUS | Status: AC
  Administered 2019-11-01: 2 g via INTRAVENOUS
  Filled 2019-11-01: qty 100

## 2019-11-01 MED ORDER — ONDANSETRON HCL 4 MG/2ML IJ SOLN: 4.0000 mg | Freq: Once | INTRAMUSCULAR | Status: DC | PRN

## 2019-11-01 MED ORDER — BUPIVACAINE LIPOSOME 1.3 % IJ SUSP
INTRAMUSCULAR | Status: DC | PRN
Start: 2019-11-01 — End: 2019-11-01
  Administered 2019-11-01: 10 mL via PERINEURAL

## 2019-11-01 MED ORDER — GLYCOPYRROLATE PF 0.2 MG/ML IJ SOSY
PREFILLED_SYRINGE | INTRAMUSCULAR | Status: DC | PRN
  Administered 2019-11-01: .2 mg via INTRAVENOUS

## 2019-11-01 MED ORDER — DEXAMETHASONE SODIUM PHOSPHATE 10 MG/ML IJ SOLN
INTRAMUSCULAR | Status: DC | PRN
  Administered 2019-11-01: 8 mg via INTRAVENOUS

## 2019-11-01 MED ORDER — ONDANSETRON HCL 4 MG PO TABS
4.0000 mg | ORAL_TABLET | Freq: Four times a day (QID) | ORAL | Status: DC | PRN
  Filled 2019-11-01: qty 1

## 2019-11-01 MED ORDER — VANCOMYCIN HCL 1000 MG IV SOLR
INTRAVENOUS | Status: DC | PRN
Start: 2019-11-01 — End: 2019-11-01
  Administered 2019-11-01: 1000 mg via TOPICAL

## 2019-11-01 MED ORDER — BUPIVACAINE HCL (PF) 0.5 % IJ SOLN
INTRAMUSCULAR | Status: DC | PRN
  Administered 2019-11-01: 10 mL via PERINEURAL

## 2019-11-01 MED ORDER — BUPIVACAINE LIPOSOME 1.3 % IJ SUSP
INTRAMUSCULAR | Status: DC | PRN
Start: 2019-11-01 — End: 2019-11-01

## 2019-11-01 MED ORDER — METOCLOPRAMIDE HCL 5 MG/ML IJ SOLN: 5.0000 mg | Freq: Three times a day (TID) | INTRAMUSCULAR | Status: DC | PRN

## 2019-11-01 MED ORDER — BUPIVACAINE HCL (PF) 0.5 % IJ SOLN: INTRAMUSCULAR | Status: DC | PRN

## 2019-11-01 MED ORDER — PROPOFOL 10 MG/ML IV BOLUS
INTRAVENOUS | Status: DC | PRN
  Administered 2019-11-01: 100 mg via INTRAVENOUS

## 2019-11-01 MED ORDER — 0.9 % SODIUM CHLORIDE (POUR BTL) OPTIME
TOPICAL | Status: DC | PRN
Start: 2019-11-01 — End: 2019-11-01
  Administered 2019-11-01: 1000 mL

## 2019-11-01 MED ORDER — FENTANYL CITRATE (PF) 100 MCG/2ML IJ SOLN: 25.0000 ug | INTRAMUSCULAR | Status: DC | PRN

## 2019-11-01 MED ORDER — ONDANSETRON HCL 4 MG/2ML IJ SOLN
INTRAMUSCULAR | Status: AC
  Filled 2019-11-01: qty 4

## 2019-11-01 MED ORDER — FENTANYL CITRATE (PF) 100 MCG/2ML IJ SOLN
INTRAMUSCULAR | Status: DC | PRN
  Administered 2019-11-01: 50 ug via INTRAVENOUS
  Administered 2019-11-01 (×2): 25 ug via INTRAVENOUS

## 2019-11-01 MED ORDER — ONDANSETRON HCL 4 MG PO TABS: 4.0000 mg | ORAL_TABLET | Freq: Three times a day (TID) | ORAL | 0 refills | Status: DC | PRN

## 2019-11-01 MED ORDER — MIDAZOLAM HCL 2 MG/2ML IJ SOLN
INTRAMUSCULAR | Status: AC
  Filled 2019-11-01: qty 2

## 2019-11-01 MED ORDER — MIDAZOLAM HCL 5 MG/5ML IJ SOLN
INTRAMUSCULAR | Status: DC | PRN
  Administered 2019-11-01 (×2): 1 mg via INTRAVENOUS

## 2019-11-01 SURGICAL SUPPLY — 70 items
ADH SKN CLS APL DERMABOND .7 (GAUZE/BANDAGES/DRESSINGS) ×1
AID PSTN UNV HD RSTRNT DISP (MISCELLANEOUS) ×1
BAG SPEC THK2 15X12 ZIP CLS (MISCELLANEOUS) ×1
BAG ZIPLOCK 12X15 (MISCELLANEOUS) ×3 IMPLANT
BLADE SAW SGTL 83.5X18.5 (BLADE) ×3 IMPLANT
BSPLAT GLND +2X24 MDLR (Joint) ×1 IMPLANT
COOLER ICEMAN CLASSIC (MISCELLANEOUS) ×3 IMPLANT
COVER BACK TABLE 60X90IN (DRAPES) ×3 IMPLANT
COVER SURGICAL LIGHT HANDLE (MISCELLANEOUS) ×3 IMPLANT
COVER WAND RF STERILE (DRAPES) ×3 IMPLANT
CUP SUT UNIV REVERS 36 NEUTRAL (Cup) ×3 IMPLANT
DERMABOND ADVANCED (GAUZE/BANDAGES/DRESSINGS) ×2
DERMABOND ADVANCED .7 DNX12 (GAUZE/BANDAGES/DRESSINGS) ×1 IMPLANT
DRAPE INCISE IOBAN 66X45 STRL (DRAPES) IMPLANT
DRAPE ORTHO SPLIT 77X108 STRL (DRAPES) ×6
DRAPE SHEET LG 3/4 BI-LAMINATE (DRAPES) ×3 IMPLANT
DRAPE SURG 17X11 SM STRL (DRAPES) ×3 IMPLANT
DRAPE SURG ORHT 6 SPLT 77X108 (DRAPES) ×2 IMPLANT
DRAPE U-SHAPE 47X51 STRL (DRAPES) ×3 IMPLANT
DRESSING AQUACEL AG SP 3.5X6 (GAUZE/BANDAGES/DRESSINGS) ×1 IMPLANT
DRSG AQUACEL AG ADV 3.5X10 (GAUZE/BANDAGES/DRESSINGS) ×3 IMPLANT
DRSG AQUACEL AG SP 3.5X6 (GAUZE/BANDAGES/DRESSINGS) ×3
DURAPREP 26ML APPLICATOR (WOUND CARE) ×3 IMPLANT
ELECT BLADE TIP CTD 4 INCH (ELECTRODE) ×3 IMPLANT
ELECT REM PT RETURN 15FT ADLT (MISCELLANEOUS) ×3 IMPLANT
FACESHIELD WRAPAROUND (MASK) ×12 IMPLANT
GLENOID UNI REV MOD 24 +2 LAT (Joint) ×3 IMPLANT
GLENOSPHERE 36 +4 LAT/24 (Joint) ×3 IMPLANT
GLOVE BIO SURGEON STRL SZ7.5 (GLOVE) ×3 IMPLANT
GLOVE BIO SURGEON STRL SZ8 (GLOVE) ×3 IMPLANT
GLOVE SS BIOGEL STRL SZ 7 (GLOVE) ×1 IMPLANT
GLOVE SS BIOGEL STRL SZ 7.5 (GLOVE) ×1 IMPLANT
GLOVE SUPERSENSE BIOGEL SZ 7 (GLOVE) ×2
GLOVE SUPERSENSE BIOGEL SZ 7.5 (GLOVE) ×2
GLOVE SURG SYN 7.0 (GLOVE) ×3 IMPLANT
GLOVE SURG SYN 7.5  E (GLOVE) ×6
GLOVE SURG SYN 7.5 E (GLOVE) ×2 IMPLANT
GLOVE SURG SYN 8.0 (GLOVE) ×3 IMPLANT
GOWN STRL REUS W/TWL LRG LVL3 (GOWN DISPOSABLE) ×6 IMPLANT
KIT BASIN (CUSTOM PROCEDURE TRAY) ×3 IMPLANT
KIT TURNOVER KIT A (KITS) IMPLANT
LINER HUMERAL 36 +3MM SM (Shoulder) ×3 IMPLANT
MANIFOLD NEPTUNE II (INSTRUMENTS) ×3 IMPLANT
NEEDLE TAPERED W/ NITINOL LOOP (MISCELLANEOUS) ×3 IMPLANT
NS IRRIG 1000ML POUR BTL (IV SOLUTION) ×3 IMPLANT
PACK SHOULDER (CUSTOM PROCEDURE TRAY) ×3 IMPLANT
PAD ARMBOARD 7.5X6 YLW CONV (MISCELLANEOUS) ×3 IMPLANT
PAD COLD SHLDR WRAP-ON (PAD) ×3 IMPLANT
PIN SET MODULAR GLENOID SYSTEM (PIN) ×3 IMPLANT
RESTRAINT HEAD UNIVERSAL NS (MISCELLANEOUS) ×3 IMPLANT
SCREW CENTRAL MODULAR 25 (Screw) ×3 IMPLANT
SCREW PERI LOCK 5.5X24 (Screw) ×9 IMPLANT
SCREW PERIPHERAL 5.5X20 LOCK (Screw) ×3 IMPLANT
SLING ARM FOAM STRAP LRG (SOFTGOODS) IMPLANT
SLING ARM FOAM STRAP MED (SOFTGOODS) ×6 IMPLANT
SPONGE LAP 18X18 RF (DISPOSABLE) IMPLANT
STEM HUMERAL UNIVER REV SIZE 7 (Stem) ×3 IMPLANT
SUCTION FRAZIER HANDLE 12FR (TUBING) ×3
SUCTION TUBE FRAZIER 12FR DISP (TUBING) ×1 IMPLANT
SUT FIBERWIRE #2 38 T-5 BLUE (SUTURE)
SUT MNCRL AB 3-0 PS2 18 (SUTURE) ×3 IMPLANT
SUT MON AB 2-0 CT1 36 (SUTURE) ×3 IMPLANT
SUT VIC AB 1 CT1 36 (SUTURE) ×3 IMPLANT
SUTURE FIBERWR #2 38 T-5 BLUE (SUTURE) IMPLANT
SUTURE TAPE 1.3 40 TPR END (SUTURE) ×2 IMPLANT
SUTURETAPE 1.3 40 TPR END (SUTURE) ×6
TOWEL OR 17X26 10 PK STRL BLUE (TOWEL DISPOSABLE) ×3 IMPLANT
TOWEL OR NON WOVEN STRL DISP B (DISPOSABLE) ×3 IMPLANT
WATER STERILE IRR 1000ML POUR (IV SOLUTION) ×6 IMPLANT
YANKAUER SUCT BULB TIP 10FT TU (MISCELLANEOUS) ×3 IMPLANT

## 2019-11-01 NOTE — Anesthesia Procedure Notes (Signed)
Anesthesia Regional Block: Interscalene brachial plexus block   Pre-Anesthetic Checklist: ,, timeout performed, Correct Patient, Correct Site, Correct Laterality, Correct Procedure, Correct Position, site marked, Risks and benefits discussed,  Surgical consent,  Pre-op evaluation,  At surgeon's request and post-op pain management  Laterality: Right  Prep: chloraprep       Needles:  Injection technique: Single-shot  Needle Type: Stimulator Needle - 40     Needle Length: 4cm  Needle Gauge: 22     Additional Needles:   Procedures:,,,, ultrasound used (permanent image in chart),,,,  Narrative:  Start time: 11/01/2019 7:01 AM End time: 11/01/2019 7:06 AM Injection made incrementally with aspirations every 5 mL. Anesthesiologist: Nolon Nations, MD  Additional Notes: BP cuff, EKG monitors applied. Sedation begun. Nerve location verified with U/S. Anesthetic injected incrementally, slowly , and after neg aspirations under direct u/s guidance. Good perineural spread. Tolerated well.

## 2019-11-01 NOTE — Transfer of Care (Signed)
Immediate Anesthesia Transfer of Care Note  Patient: Cynthia [name]  Procedure(s) Performed: Procedure(s) with comments: REVERSE SHOULDER ARTHROPLASTY (Right) - 176min  Patient Location: PACU  Anesthesia Type:General  Level of Consciousness:  sedated, patient cooperative and responds to stimulation  Airway & Oxygen Therapy:Patient Spontanous Breathing and Patient connected to face mask oxgen  Post-op Assessment:  Report given to PACU RN and Post -op Vital signs reviewed and stable  Post vital signs:  Reviewed and stable  Last Vitals:  Vitals:   11/01/19 0546 11/01/19 0925  BP: 128/82 135/75  Pulse: 84 78  Resp: 11 13  Temp: 36.7 C 36.6 C  SpO2: 751% 700%    Complications: No apparent anesthesia complications

## 2019-11-01 NOTE — Anesthesia Postprocedure Evaluation (Signed)
Anesthesia Post Note  Patient: Cynthia [name]  Procedure(s) Performed: REVERSE SHOULDER ARTHROPLASTY (Right Shoulder)     Patient location during evaluation: PACU Anesthesia Type: General Level of consciousness: sedated and patient cooperative Pain management: pain level controlled Vital Signs Assessment: post-procedure vital signs reviewed and stable Respiratory status: spontaneous breathing Cardiovascular status: stable Anesthetic complications: no   No complications documented.  Last Vitals:  Vitals:   11/01/19 1036 11/01/19 1121  BP: (!) 121/94 128/85  Pulse: 72 73  Resp: 16 18  Temp: (!) 36.3 C   SpO2: 99% 93%    Last Pain:  Vitals:   11/01/19 1121  TempSrc:   PainSc: 0-No pain                 Nolon Nations

## 2019-11-01 NOTE — Anesthesia Procedure Notes (Signed)
Procedure Name: Intubation Date/Time: 11/01/2019 8:05 AM Performed by: Lavina Hamman, CRNA Pre-anesthesia Checklist: Patient identified, Emergency Drugs available, Suction available, Patient being monitored and Timeout performed Patient Re-evaluated:Patient Re-evaluated prior to induction Oxygen Delivery Method: Circle system utilized Preoxygenation: Pre-oxygenation with 100% oxygen Induction Type: IV induction Ventilation: Mask ventilation without difficulty Laryngoscope Size: Mac and 3 Grade View: Grade I Tube type: Oral Tube size: 7.0 mm Number of attempts: 1 Airway Equipment and Method: Stylet Placement Confirmation: ETT inserted through vocal cords under direct vision,  positive ETCO2,  CO2 detector and breath sounds checked- equal and bilateral Secured at: 22 cm Tube secured with: Tape Dental Injury: Teeth and Oropharynx as per pre-operative assessment  Comments: ATOI

## 2019-11-01 NOTE — Discharge Instructions (Signed)
 Kevin M. Supple, M.D., F.A.A.O.S. Orthopaedic Surgery Specializing in Arthroscopic and Reconstructive Surgery of the Shoulder [phone] [address] - [place], [place] [address] - Fax [phone]   POST-OP TOTAL SHOULDER REPLACEMENT INSTRUCTIONS  1. Follow up in the office for your first post-op appointment 10-14 days from the date of your surgery. If you do not already have a scheduled appointment, our office will contact you to schedule.  2. The bandage over your incision is waterproof. You may begin showering with this dressing on. You may leave this dressing on until first follow up appointment within 2 weeks. We prefer you leave this dressing in place until follow up however after 5-7 days if you are having itching or skin irritation and would like to remove it you may do so. Go slow and tug at the borders gently to break the bond the dressing has with the skin. At this point if there is no drainage it is okay to go without a bandage or you may cover it with a light guaze and tape. You can also expect significant bruising around your shoulder that will drift down your arm and into your chest wall. This is very normal and should resolve over several days.   3. Wear your sling/immobilizer at all times except to perform the exercises below or to occasionally let your arm dangle by your side to stretch your elbow. You also need to sleep in your sling immobilizer until instructed otherwise. It is ok to remove your sling if you are sitting in a controlled environment and allow your arm to rest in a position of comfort by your side or on your lap with pillows to give your neck and skin a break from the sling. You may remove it to allow arm to dangle by side to shower. If you are up walking around and when you go to sleep at night you need to wear it.  4. Range of motion to your elbow, wrist, and hand are encouraged 3-5 times daily. Exercise to your hand and fingers helps to reduce  swelling you may experience.   5. Prescriptions for a pain medication and a muscle relaxant are provided for you. It is recommended that if you are experiencing pain that you pain medication alone is not controlling, add the muscle relaxant along with the pain medication which can give additional pain relief. The first 1-2 days is generally the most severe of your pain and then should gradually decrease. As your pain lessens it is recommended that you decrease your use of the pain medications to an "as needed basis'" only and to always comply with the recommended dosages of the pain medications.  6. Pain medications can produce constipation along with their use. If you experience this, the use of an over the counter stool softener or laxative daily is recommended.   7. For additional questions or concerns, please do not hesitate to call the office. If after hours there is an answering service to forward your concerns to the physician on call.  8.Pain control following an exparel block  To help control your post-operative pain you received a nerve block  performed with Exparel which is a long acting anesthetic (numbing agent) which can provide pain relief and sensations of numbness (and relief of pain) in the operative shoulder and arm for up to 3 days. Sometimes it provides mixed relief, meaning you may still have numbness in certain areas of the arm but can still be able to   move  parts of that arm, hand, and fingers. We recommend that your prescribed pain medications  be used as needed. We do not feel it is necessary to "pre medicate" and "stay ahead" of pain.  Taking narcotic pain medications when you are not having any pain can lead to unnecessary and potentially dangerous side effects.    9. Use the ice machine as much as possible in the first 5-7 days from surgery, then you can wean its use to as needed. The ice typically needs to be replaced every 6 hours, instead of ice you can actually freeze  water bottles to put in the cooler and then fill water around them to avoid having to purchase ice. You can have spare water bottles freezing to allow you to rotate them once they have melted. Try to have a thin shirt or light cloth or towel under the ice wrap to protect your skin.   10.  We recommend that you avoid any dental work or cleaning in the first 3 months following your joint replacement. This is to help minimize the possibility of infection from the bacteria in your mouth that enters your bloodstream during dental work. We also recommend that you take an antibiotic prior to your dental work for the first year after your shoulder replacement to further help reduce that risk. Please simply contact our office for antibiotics to be sent to your pharmacy prior to dental work.  11. Dental Antibiotics:  In most cases prophylactic antibiotics for Dental procdeures after total joint surgery are not necessary.  Exceptions are as follows:  1. History of prior total joint infection  2. Severely immunocompromised (Organ Transplant, cancer chemotherapy, Rheumatoid biologic meds such as Humera)  3. Poorly controlled diabetes (A1C &gt; 8.0, blood glucose over 200)  If you have one of these conditions, contact your surgeon for an antibiotic prescription, prior to your dental procedure.   POST-OP EXERCISES  Pendulum Exercises  Perform pendulum exercises while standing and bending at the waist. Support your uninvolved arm on a table or chair and allow your operated arm to hang freely. Make sure to do these exercises passively - not using you shoulder muscles. These exercises can be performed once your nerve block effects have worn off.  Repeat 20 times. Do 3 sessions per day.     
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
## 2019-11-01 NOTE — Op Note (Signed)
11/01/2019  9:20 AM  PATIENT:   Cynthia [name]  69 y.o. female  PRE-OPERATIVE DIAGNOSIS:  Right shoulder rotator cuff tear arthropathy  POST-OPERATIVE DIAGNOSIS: Same  PROCEDURE: Right shoulder reverse arthroplasty utilizing a press-fit size 7 Arthrex stem with a neutral metaphysis, +3 polyethylene insert, 36/+4 glenosphere on a small/+2 baseplate with a 25 mm central lag screw.  SURGEON:  Marin Shutter M.D.  ASSISTANTS: Jenetta Loges, PA-C  ANESTHESIA:   General endotracheal and interscalene block with Exparel  EBL: 100 cc  SPECIMEN: None  Drains: None   PATIENT DISPOSITION:  PACU - hemodynamically stable.    PLAN OF CARE: Discharge to home after PACU  Brief history:  Cynthia [name] is a 69 year old female who has had longstanding difficulties with right shoulder pain related to a rotator cuff tear arthropathy.  Her symptoms have been progressively increasing and at this time she is brought to the operating for planned right shoulder reverse arthroplasty.  Preoperatively, I counseled the patient regarding treatment options and risks versus benefits thereof.  Possible surgical complications were all reviewed including potential for bleeding, infection, neurovascular injury, persistent pain, loss of motion, anesthetic complication, failure of the implant, and possible need for additional surgery. They understand and accept and agrees with our planned procedure.   Procedure in detail:  After undergoing routine preop evaluation patient received prophylactic antibiotics and interscalene block with Exparel was established in the holding area by the anesthesia department.  Patient subsequently placed supine on the operating table and underwent the smooth induction of a general endotracheal anesthesia.  Subsequently placed into the beachchair position and appropriately padded and protected.  The right shoulder girdle region was sterilely prepped and draped in standard fashion.   Timeout was called.  A 6 cm anterior deltopectoral approach was made with sharp dissection carried down through skin and subcu tissue and electrocautery was used for hemostasis.  The deltopectoral interval was then developed from proximal to distal with the vein taken laterally and adhesions were then divided beneath the deltoid and the upper centimeter the pectoralis major was released to enhance exposure.  The conjoined tendon was then mobilized and retracted medially.  The subscapularis was then divided from the lesser tuberosity and tagged with a pair of suture tape sutures.  The capsular attachments on the anterior and infra margins of the humeral neck were then divided allowing deliver the humeral head through the wound.  We outlined our proposed humeral head resection with the extra medullary guide and the oscillating saw was then used to remove the humeral head and a metal cap was then placed over the cut proximal humeral surface.  We then exposed the glenoid with appropriate retractors and performed a circumferential labral resection.  A guide was then directed into the center of the glenoid and a neutral alignment.  Glenoid was then reamed with a central followed by peripheral reamers and the central drill hole was then appropriately drilled and tapped.  Our baseplate was then assembled and the lag screw was coated with vancomycin powder and the baseplate was then inserted achieving excellent fit and fixation.  The peripheral locking screws were all then placed obtaining excellent purchase and fixation again vancomycin powder placed on the threads prior to insertion.  At this point we returned our attention back to the proximal humerus where the canal was opened with a hand reamer and we did remove several retained suture anchors.  We then broached up to a size 7 and used a neutral metaphyseal  reamer.  A trial reduction was then performed showing good soft tissue balance good motion good stability.  Trial  was then removed our final implant was then assembled.  Vancomycin powder was then spread liberally into the humeral canal on the stem of the implant the implant was then impacted with excellent fit and fixation.  Trial reduction again showed a +3 polygiving excellent soft tissue balance and good stability.  Our trial was then removed the final +3 polywas then impacted final reduction was then performed of the joint was copes irrigated.  Upon final reduction and very pleased with the overall motion and soft tissue balance and stability.  At this point we confirmed that the subscapularis had good mobility.  Was then repaired back to the collar of our implant using the previously placed suture tape sutures.  Upon completion the arm easily achieved 45 degrees of external rotation without excessive tension on the subscap.  Final irrigation was then completed.  The balance of the vancomycin powder was then placed in the deep and superficial layers of the wound.  The deltopectoral interval was reapproximated a series of figure-of-eight and 1 Vicryl sutures.  2-0 Vicryl used for subcu layer and intracuticular 3 Monocryl for the skin followed by Dermabond and Aquasol dressing.  Right arm placed in sling.  The patient was awakened, extubated, and taken to department in stable condition.  Jenetta Loges, PA-C was utilized as an Environmental consultant throughout this case, essential for help with positioning the patient, positioning extremity, tissue manipulation, implantation of the prosthesis, suture management, wound closure, and intraoperative decision-making.  Marin Shutter MD   Contact # [phone]

## 2019-11-01 NOTE — Evaluation (Signed)
Occupational Therapy Evaluation Patient Details Name: Cynthia [name] MRN: [id number] DOB: 20-Dec-1950 Today's Date: 11/01/2019    History of Present Illness Patient s/p R reverse TSA   Clinical Impression   Patient s/p R reverse TSA without functional use of right upper extremity secondary to surgery and interscalene block. Therapist provided education and instruction to patient in regards to exercises, precautions, positioning, donning upper extremity clothing and bathing while maintaining shoulder precautions, ice and edema management and donning/doffing sling. Patient verbalized understanding and demonstrated as needed. Patient needed mod assist to donn shirt but only set up for lower body dressing. Patient to follow up with MD for further therapy needs.       Follow Up Recommendations  Follow surgeon's recommendation for DC plan and follow-up therapies    Equipment Recommendations  None recommended by OT    Recommendations for Other Services       Precautions / Restrictions Precautions Precautions: Shoulder Shoulder Interventions: Off for dressing/bathing/exercises;At all times;Shoulder sling/immobilizer Precaution Booklet Issued: No Required Braces or Orthoses: Sling Restrictions Weight Bearing Restrictions: Yes RUE Weight Bearing: Non weight bearing      Mobility Bed Mobility                  Transfers                 General transfer comment: Min guard for safety secondary to effects of medication.    Balance Overall balance assessment: No apparent balance deficits (not formally assessed)                                         ADL either performed or assessed with clinical judgement   ADL                                         General ADL Comments: Mod assist for donning shirt. Set up for lower body dressing - donning underwear, shorts, doffing socks and donning sandals.     Vision Baseline  Vision/History: Wears glasses Vision Assessment?: No apparent visual deficits     Perception     Praxis      Pertinent Vitals/Pain Pain Assessment: No/denies pain     Hand Dominance Right   Extremity/Trunk Assessment Upper Extremity Assessment Upper Extremity Assessment: RUE deficits/detail RUE Deficits / Details: without AROM except for some finger movement secondary to interscalene block           Communication     Cognition Arousal/Alertness: Awake/alert Behavior During Therapy: WFL for tasks assessed/performed Overall Cognitive Status: Within Functional Limits for tasks assessed                                     General Comments       Exercises     Shoulder Instructions Shoulder Instructions Donning/doffing shirt without moving shoulder: Patient able to independently direct caregiver;Moderate assistance Method for sponge bathing under operated UE: Patient able to independently direct caregiver Donning/doffing sling/immobilizer: Patient able to independently direct caregiver Correct positioning of sling/immobilizer: Patient able to independently direct caregiver Pendulum exercises (written home exercise program): Patient able to independently direct caregiver ROM for elbow, wrist and digits of operated UE: Patient able to independently direct caregiver  Sling wearing schedule (on at all times/off for ADL's): Patient able to independently direct caregiver Proper positioning of operated UE when showering: Patient able to independently direct caregiver Dressing change: Patient able to independently direct caregiver Positioning of UE while sleeping: Patient able to independently direct caregiver    Home Living Family/patient expects to be discharged to:: Private residence Living Arrangements: Spouse/significant other                                      Prior Functioning/Environment Level of Independence: Independent                  OT Problem List: Impaired UE functional use      OT Treatment/Interventions:      OT Goals(Current goals can be found in the care plan section) Acute Rehab OT Goals Patient Stated Goal: did not state OT Goal Formulation: All assessment and education complete, DC therapy  OT Frequency:     Barriers to D/C:            Co-evaluation              AM-PAC OT "6 Clicks" Daily Activity     Outcome Measure Help from another person eating meals?: A Little Help from another person taking care of personal grooming?: A Little Help from another person toileting, which includes using toliet, bedpan, or urinal?: None Help from another person bathing (including washing, rinsing, drying)?: None Help from another person to put on and taking off regular upper body clothing?: A Lot Help from another person to put on and taking off regular lower body clothing?: A Little 6 Click Score: 19   End of Session Nurse Communication:  (okay to discharge, patient missing strap for ICE cuff.)  Activity Tolerance: Patient tolerated treatment well Patient left: in chair;with call bell/phone within reach  OT Visit Diagnosis: Muscle weakness (generalized) (M62.81)                Time: 9371-6967 OT Time Calculation (min): 23 min Charges:  OT General Charges $OT Visit: 1 Visit OT Evaluation $OT Eval Low Complexity: 1 Low OT Treatments $Self Care/Home Management : 8-22 mins  Monque Haggar, OTR/L [place]  Office [phone] Pager: [place] 11/01/2019, 1:45 PM

## 2019-11-02 ENCOUNTER — Encounter (HOSPITAL_COMMUNITY): Payer: Self-pay | Admitting: Orthopedic Surgery

## 2019-11-12 DIAGNOSIS — Z96611 Presence of right artificial shoulder joint: Secondary | ICD-10-CM | POA: Diagnosis not present

## 2019-11-12 DIAGNOSIS — Z471 Aftercare following joint replacement surgery: Secondary | ICD-10-CM | POA: Diagnosis not present

## 2019-11-29 DIAGNOSIS — M25511 Pain in right shoulder: Secondary | ICD-10-CM | POA: Diagnosis not present

## 2019-11-29 DIAGNOSIS — M25611 Stiffness of right shoulder, not elsewhere classified: Secondary | ICD-10-CM | POA: Diagnosis not present

## 2019-12-04 DIAGNOSIS — M25611 Stiffness of right shoulder, not elsewhere classified: Secondary | ICD-10-CM | POA: Diagnosis not present

## 2019-12-04 DIAGNOSIS — M25511 Pain in right shoulder: Secondary | ICD-10-CM | POA: Diagnosis not present

## 2019-12-07 DIAGNOSIS — M25511 Pain in right shoulder: Secondary | ICD-10-CM | POA: Diagnosis not present

## 2019-12-07 DIAGNOSIS — M25611 Stiffness of right shoulder, not elsewhere classified: Secondary | ICD-10-CM | POA: Diagnosis not present

## 2019-12-10 DIAGNOSIS — M25611 Stiffness of right shoulder, not elsewhere classified: Secondary | ICD-10-CM | POA: Diagnosis not present

## 2019-12-10 DIAGNOSIS — M25511 Pain in right shoulder: Secondary | ICD-10-CM | POA: Diagnosis not present

## 2019-12-12 DIAGNOSIS — M25611 Stiffness of right shoulder, not elsewhere classified: Secondary | ICD-10-CM | POA: Diagnosis not present

## 2019-12-12 DIAGNOSIS — M25511 Pain in right shoulder: Secondary | ICD-10-CM | POA: Diagnosis not present

## 2019-12-18 DIAGNOSIS — M25511 Pain in right shoulder: Secondary | ICD-10-CM | POA: Diagnosis not present

## 2019-12-18 DIAGNOSIS — M25611 Stiffness of right shoulder, not elsewhere classified: Secondary | ICD-10-CM | POA: Diagnosis not present

## 2019-12-21 DIAGNOSIS — M25511 Pain in right shoulder: Secondary | ICD-10-CM | POA: Diagnosis not present

## 2019-12-21 DIAGNOSIS — M25611 Stiffness of right shoulder, not elsewhere classified: Secondary | ICD-10-CM | POA: Diagnosis not present

## 2019-12-24 DIAGNOSIS — M25511 Pain in right shoulder: Secondary | ICD-10-CM | POA: Diagnosis not present

## 2019-12-24 DIAGNOSIS — R7989 Other specified abnormal findings of blood chemistry: Secondary | ICD-10-CM | POA: Diagnosis not present

## 2019-12-24 DIAGNOSIS — D649 Anemia, unspecified: Secondary | ICD-10-CM | POA: Diagnosis not present

## 2019-12-24 DIAGNOSIS — M859 Disorder of bone density and structure, unspecified: Secondary | ICD-10-CM | POA: Diagnosis not present

## 2019-12-24 DIAGNOSIS — M25611 Stiffness of right shoulder, not elsewhere classified: Secondary | ICD-10-CM | POA: Diagnosis not present

## 2019-12-26 DIAGNOSIS — M25611 Stiffness of right shoulder, not elsewhere classified: Secondary | ICD-10-CM | POA: Diagnosis not present

## 2019-12-31 DIAGNOSIS — Z0000 Encounter for general adult medical examination without abnormal findings: Secondary | ICD-10-CM | POA: Diagnosis not present

## 2019-12-31 DIAGNOSIS — M1990 Unspecified osteoarthritis, unspecified site: Secondary | ICD-10-CM | POA: Diagnosis not present

## 2019-12-31 DIAGNOSIS — R600 Localized edema: Secondary | ICD-10-CM | POA: Diagnosis not present

## 2019-12-31 DIAGNOSIS — R1310 Dysphagia, unspecified: Secondary | ICD-10-CM | POA: Diagnosis not present

## 2019-12-31 DIAGNOSIS — M25511 Pain in right shoulder: Secondary | ICD-10-CM | POA: Diagnosis not present

## 2019-12-31 DIAGNOSIS — M25611 Stiffness of right shoulder, not elsewhere classified: Secondary | ICD-10-CM | POA: Diagnosis not present

## 2019-12-31 DIAGNOSIS — F985 Adult onset fluency disorder: Secondary | ICD-10-CM | POA: Diagnosis not present

## 2019-12-31 DIAGNOSIS — J309 Allergic rhinitis, unspecified: Secondary | ICD-10-CM | POA: Diagnosis not present

## 2019-12-31 DIAGNOSIS — M8580 Other specified disorders of bone density and structure, unspecified site: Secondary | ICD-10-CM | POA: Diagnosis not present

## 2019-12-31 DIAGNOSIS — G43909 Migraine, unspecified, not intractable, without status migrainosus: Secondary | ICD-10-CM | POA: Diagnosis not present

## 2019-12-31 DIAGNOSIS — D649 Anemia, unspecified: Secondary | ICD-10-CM | POA: Diagnosis not present

## 2019-12-31 DIAGNOSIS — R42 Dizziness and giddiness: Secondary | ICD-10-CM | POA: Diagnosis not present

## 2019-12-31 DIAGNOSIS — N3281 Overactive bladder: Secondary | ICD-10-CM | POA: Diagnosis not present

## 2020-01-02 ENCOUNTER — Encounter: Payer: Self-pay | Admitting: Obstetrics and Gynecology

## 2020-01-02 ENCOUNTER — Other Ambulatory Visit: Payer: Self-pay

## 2020-01-02 ENCOUNTER — Ambulatory Visit (INDEPENDENT_AMBULATORY_CARE_PROVIDER_SITE_OTHER): Payer: PPO | Admitting: Obstetrics and Gynecology

## 2020-01-02 VITALS — BP 118/70 | Ht 61.0 in | Wt 132.0 lb

## 2020-01-02 DIAGNOSIS — M25611 Stiffness of right shoulder, not elsewhere classified: Secondary | ICD-10-CM | POA: Diagnosis not present

## 2020-01-02 DIAGNOSIS — M8580 Other specified disorders of bone density and structure, unspecified site: Secondary | ICD-10-CM

## 2020-01-02 DIAGNOSIS — Z01411 Encounter for gynecological examination (general) (routine) with abnormal findings: Secondary | ICD-10-CM

## 2020-01-02 DIAGNOSIS — N950 Postmenopausal bleeding: Secondary | ICD-10-CM

## 2020-01-02 DIAGNOSIS — M25511 Pain in right shoulder: Secondary | ICD-10-CM | POA: Diagnosis not present

## 2020-01-02 DIAGNOSIS — M8588 Other specified disorders of bone density and structure, other site: Secondary | ICD-10-CM | POA: Diagnosis not present

## 2020-01-02 NOTE — Progress Notes (Signed)
Cynthia [name] 07/18/1950 [id number]  SUBJECTIVE:  69 y.o. G6P0060 female for annual routine gynecologic exam. She has no gynecologic concerns.  Works as a Sports coach in dermatology.  Current Outpatient Medications  Medication Sig Dispense Refill  . aspirin 81 MG tablet Take 81 mg by mouth daily.    Marland Kitchen aspirin EC 81 MG tablet Take 81 mg by mouth at bedtime.    . Calcium Carb-Cholecalciferol (CALCIUM 600+D3 PO) Take 1 tablet by mouth in the morning and at bedtime.    . Fish Oil-Cholecalciferol (FISH OIL + D3) 1200-1000 MG-UNIT CAPS Take 2 capsules by mouth at bedtime.    Marland Kitchen ketotifen (ZADITOR) 0.025 % ophthalmic solution Place 1 drop into both eyes 2 (two) times daily as needed (allergy eyes.).    Marland Kitchen KLS ALLER-TEC 10 MG tablet Take 10 mg by mouth at bedtime.    . levETIRAcetam (KEPPRA) 500 MG tablet Take 62.5 mg by mouth daily. 1/8 of tablet each morning.    . Magnesium 250 MG TABS Take 500 mg by mouth at bedtime.    . Olopatadine HCl (PATADAY OP) Apply to eye.    Vladimir Faster Glycol-Propyl Glycol (LUBRICANT EYE DROPS) 0.4-0.3 % SOLN Place 1 drop into both eyes 3 (three) times daily as needed (dry/irritated eyes.).     No current facility-administered medications for this visit.   Allergies: Sulfa antibiotics and Penicillins  No LMP recorded. Patient is postmenopausal.  Past medical history,surgical history, problem list, medications, allergies, family history and social history were all reviewed and documented as reviewed in the EPIC chart.  ROS:  Feeling well. No dyspnea or chest pain on exertion.  No abdominal pain, change in bowel habits, black or bloody stools.  No urinary tract symptoms. GYN ROS: no abnormal bleeding, pelvic pain or discharge, no breast pain or new or enlarging lumps on self exam.No neurological complaints.    OBJECTIVE:  Ht 5\' 1"  (1.549 m)   Wt 132 lb (59.9 kg)   BMI 24.94 kg/m  The patient appears well, alert, oriented x 3, in no distress. ENT normal.  Neck  supple. No cervical or supraclavicular adenopathy or thyromegaly.  Lungs are clear, good air entry, no wheezes, rhonchi or rales. S1 and S2 normal, no murmurs, regular rate and rhythm.  Abdomen soft without tenderness, guarding, mass or organomegaly.  Neurological is normal, no focal findings.  BREAST EXAM: breasts appear normal, no suspicious masses, no skin or nipple changes or axillary nodes  PELVIC EXAM: VULVA: normal atrophic appearing vulva with no masses, tenderness or lesions, VAGINA: Upon gently entering the vagina with a speculum, there is a scant amount of bright red blood concentrated over the right vaginal sidewall, the cervix is deeply tipped posteriorly and the patient is too uncomfortable with the exam to proceed with trying to get a better view of the cervix due to discomfort.  Otherwise the vagina appears normal with normal color and no discharge, no lesions  Chaperone: Aurora Mask (DNP student) present during the exam  ASSESSMENT:  69 y.o. [id number] here for annual gynecologic exam  PLAN:    1. Postmenopausal.  Manageable hot flashes or night sweats.  She does not note specifically any red vaginal bleeding, but with the findings on exam today, she does mention that in the past few years she has had an intermittent brown vaginal discharge that she thought was "old blood."  Since this could possibly represent postmenopausal bleeding coming from the uterus, I think it would be the most thorough  option to have further evaluation with a pelvic ultrasound.  We will proceed to get this scheduled at the checkout desk. 2. Pap smear 2018.  No significant history of abnormal Pap smears.  We will check in at her upcoming appointment to get a Pap smear when we are able to more clearly view her cervix. 3. Mammogram 12/2018.  Normal breast exam today.  Recommend scheduling an annual mammogram. 4. Colonoscopy 2017.  Recommended that she follow up at the recommended interval.   5. DEXA 12/2018.   T score -2.2 in forearm, FRAX 10% / 2.0%.  Results discussed today.  She is taking vitamin D and calcium and staying physically active.  Would plan to recheck DEXA next year. 6. Health maintenance.  No labs today as she normally has these completed elsewhere.  Return annually or sooner, prn.  Joseph Pierini MD 01/02/20

## 2020-01-07 DIAGNOSIS — M25611 Stiffness of right shoulder, not elsewhere classified: Secondary | ICD-10-CM | POA: Diagnosis not present

## 2020-01-09 DIAGNOSIS — M25611 Stiffness of right shoulder, not elsewhere classified: Secondary | ICD-10-CM | POA: Diagnosis not present

## 2020-01-09 DIAGNOSIS — M25511 Pain in right shoulder: Secondary | ICD-10-CM | POA: Diagnosis not present

## 2020-01-14 ENCOUNTER — Encounter: Payer: Self-pay | Admitting: Obstetrics and Gynecology

## 2020-01-14 DIAGNOSIS — Z1231 Encounter for screening mammogram for malignant neoplasm of breast: Secondary | ICD-10-CM | POA: Diagnosis not present

## 2020-01-14 DIAGNOSIS — M25611 Stiffness of right shoulder, not elsewhere classified: Secondary | ICD-10-CM | POA: Diagnosis not present

## 2020-01-22 ENCOUNTER — Ambulatory Visit: Payer: PPO | Admitting: Obstetrics and Gynecology

## 2020-01-22 ENCOUNTER — Other Ambulatory Visit: Payer: PPO

## 2020-01-24 DIAGNOSIS — Z471 Aftercare following joint replacement surgery: Secondary | ICD-10-CM | POA: Diagnosis not present

## 2020-01-24 DIAGNOSIS — Z96611 Presence of right artificial shoulder joint: Secondary | ICD-10-CM | POA: Diagnosis not present

## 2020-03-08 ENCOUNTER — Ambulatory Visit: Payer: PPO | Attending: Internal Medicine

## 2020-03-08 DIAGNOSIS — Z23 Encounter for immunization: Secondary | ICD-10-CM

## 2020-03-08 NOTE — Progress Notes (Signed)
   Covid-19 Vaccination Clinic  Name:  Cynthia [name]    MRN: [id number] DOB: 10-05-1950  03/08/2020  Cynthia [name] was observed post Covid-19 immunization for 15 minutes without incident. She was provided with Vaccine Information Sheet and instruction to access the V-Safe system.   Cynthia [name] was instructed to call 911 with any severe reactions post vaccine: Marland Kitchen Difficulty breathing  . Swelling of face and throat  . A fast heartbeat  . A bad rash all over body  . Dizziness and weakness

## 2020-04-23 DIAGNOSIS — Z471 Aftercare following joint replacement surgery: Secondary | ICD-10-CM | POA: Diagnosis not present

## 2020-04-23 DIAGNOSIS — Z96611 Presence of right artificial shoulder joint: Secondary | ICD-10-CM | POA: Diagnosis not present

## 2020-05-21 DIAGNOSIS — G5761 Lesion of plantar nerve, right lower limb: Secondary | ICD-10-CM | POA: Diagnosis not present

## 2020-05-21 DIAGNOSIS — M79671 Pain in right foot: Secondary | ICD-10-CM | POA: Diagnosis not present

## 2020-06-12 DIAGNOSIS — M5459 Other low back pain: Secondary | ICD-10-CM | POA: Diagnosis not present

## 2020-06-26 DIAGNOSIS — G5761 Lesion of plantar nerve, right lower limb: Secondary | ICD-10-CM | POA: Diagnosis not present

## 2020-06-26 DIAGNOSIS — M79671 Pain in right foot: Secondary | ICD-10-CM | POA: Diagnosis not present

## 2020-06-26 DIAGNOSIS — M7741 Metatarsalgia, right foot: Secondary | ICD-10-CM | POA: Diagnosis not present

## 2020-08-31 DIAGNOSIS — H15001 Unspecified scleritis, right eye: Secondary | ICD-10-CM | POA: Diagnosis not present

## 2020-09-03 DIAGNOSIS — H15001 Unspecified scleritis, right eye: Secondary | ICD-10-CM | POA: Diagnosis not present

## 2020-09-23 DIAGNOSIS — Z23 Encounter for immunization: Secondary | ICD-10-CM | POA: Diagnosis not present

## 2020-10-27 DIAGNOSIS — H524 Presbyopia: Secondary | ICD-10-CM | POA: Diagnosis not present

## 2020-10-27 DIAGNOSIS — Z961 Presence of intraocular lens: Secondary | ICD-10-CM | POA: Diagnosis not present

## 2020-10-27 DIAGNOSIS — H5310 Unspecified subjective visual disturbances: Secondary | ICD-10-CM | POA: Diagnosis not present

## 2020-10-27 DIAGNOSIS — H2513 Age-related nuclear cataract, bilateral: Secondary | ICD-10-CM | POA: Diagnosis not present

## 2021-01-02 DIAGNOSIS — M859 Disorder of bone density and structure, unspecified: Secondary | ICD-10-CM | POA: Diagnosis not present

## 2021-01-02 DIAGNOSIS — Z0000 Encounter for general adult medical examination without abnormal findings: Secondary | ICD-10-CM | POA: Diagnosis not present

## 2021-01-08 DIAGNOSIS — M8580 Other specified disorders of bone density and structure, unspecified site: Secondary | ICD-10-CM | POA: Diagnosis not present

## 2021-01-08 DIAGNOSIS — F439 Reaction to severe stress, unspecified: Secondary | ICD-10-CM | POA: Diagnosis not present

## 2021-01-08 DIAGNOSIS — F985 Adult onset fluency disorder: Secondary | ICD-10-CM | POA: Diagnosis not present

## 2021-01-08 DIAGNOSIS — R1310 Dysphagia, unspecified: Secondary | ICD-10-CM | POA: Diagnosis not present

## 2021-01-08 DIAGNOSIS — R600 Localized edema: Secondary | ICD-10-CM | POA: Diagnosis not present

## 2021-01-08 DIAGNOSIS — M1990 Unspecified osteoarthritis, unspecified site: Secondary | ICD-10-CM | POA: Diagnosis not present

## 2021-01-08 DIAGNOSIS — R82998 Other abnormal findings in urine: Secondary | ICD-10-CM | POA: Diagnosis not present

## 2021-01-08 DIAGNOSIS — R030 Elevated blood-pressure reading, without diagnosis of hypertension: Secondary | ICD-10-CM | POA: Diagnosis not present

## 2021-01-08 DIAGNOSIS — R42 Dizziness and giddiness: Secondary | ICD-10-CM | POA: Diagnosis not present

## 2021-01-08 DIAGNOSIS — N3281 Overactive bladder: Secondary | ICD-10-CM | POA: Diagnosis not present

## 2021-01-08 DIAGNOSIS — Z0000 Encounter for general adult medical examination without abnormal findings: Secondary | ICD-10-CM | POA: Diagnosis not present

## 2021-01-22 DIAGNOSIS — Z1231 Encounter for screening mammogram for malignant neoplasm of breast: Secondary | ICD-10-CM | POA: Diagnosis not present

## 2022-06-14 DIAGNOSIS — M545 Low back pain, unspecified: Secondary | ICD-10-CM | POA: Diagnosis not present

## 2022-06-14 DIAGNOSIS — M546 Pain in thoracic spine: Secondary | ICD-10-CM | POA: Diagnosis not present

## 2022-10-22 DIAGNOSIS — N952 Postmenopausal atrophic vaginitis: Secondary | ICD-10-CM | POA: Diagnosis not present

## 2022-10-22 DIAGNOSIS — N895 Stricture and atresia of vagina: Secondary | ICD-10-CM | POA: Diagnosis not present

## 2022-10-22 DIAGNOSIS — N898 Other specified noninflammatory disorders of vagina: Secondary | ICD-10-CM | POA: Diagnosis not present

## 2022-11-19 DIAGNOSIS — N952 Postmenopausal atrophic vaginitis: Secondary | ICD-10-CM | POA: Diagnosis not present

## 2022-12-10 DIAGNOSIS — M542 Cervicalgia: Secondary | ICD-10-CM | POA: Diagnosis not present

## 2022-12-10 DIAGNOSIS — M519 Unspecified thoracic, thoracolumbar and lumbosacral intervertebral disc disorder: Secondary | ICD-10-CM | POA: Diagnosis not present

## 2023-01-03 DIAGNOSIS — M542 Cervicalgia: Secondary | ICD-10-CM | POA: Diagnosis not present

## 2023-01-10 DIAGNOSIS — M542 Cervicalgia: Secondary | ICD-10-CM | POA: Diagnosis not present

## 2023-01-26 DIAGNOSIS — M542 Cervicalgia: Secondary | ICD-10-CM | POA: Diagnosis not present

## 2023-01-31 DIAGNOSIS — M542 Cervicalgia: Secondary | ICD-10-CM | POA: Diagnosis not present

## 2023-02-02 DIAGNOSIS — H52203 Unspecified astigmatism, bilateral: Secondary | ICD-10-CM | POA: Diagnosis not present

## 2023-02-02 DIAGNOSIS — H2513 Age-related nuclear cataract, bilateral: Secondary | ICD-10-CM | POA: Diagnosis not present

## 2023-02-02 DIAGNOSIS — H5203 Hypermetropia, bilateral: Secondary | ICD-10-CM | POA: Diagnosis not present

## 2023-02-03 DIAGNOSIS — Z1231 Encounter for screening mammogram for malignant neoplasm of breast: Secondary | ICD-10-CM | POA: Diagnosis not present

## 2023-02-14 DIAGNOSIS — M8580 Other specified disorders of bone density and structure, unspecified site: Secondary | ICD-10-CM | POA: Diagnosis not present

## 2023-02-14 DIAGNOSIS — R7989 Other specified abnormal findings of blood chemistry: Secondary | ICD-10-CM | POA: Diagnosis not present

## 2023-02-14 DIAGNOSIS — D649 Anemia, unspecified: Secondary | ICD-10-CM | POA: Diagnosis not present

## 2023-02-14 DIAGNOSIS — E785 Hyperlipidemia, unspecified: Secondary | ICD-10-CM | POA: Diagnosis not present

## 2023-02-21 DIAGNOSIS — E875 Hyperkalemia: Secondary | ICD-10-CM | POA: Diagnosis not present

## 2023-02-21 DIAGNOSIS — R1310 Dysphagia, unspecified: Secondary | ICD-10-CM | POA: Diagnosis not present

## 2023-02-21 DIAGNOSIS — R030 Elevated blood-pressure reading, without diagnosis of hypertension: Secondary | ICD-10-CM | POA: Diagnosis not present

## 2023-02-21 DIAGNOSIS — R82998 Other abnormal findings in urine: Secondary | ICD-10-CM | POA: Diagnosis not present

## 2023-02-21 DIAGNOSIS — Z0000 Encounter for general adult medical examination without abnormal findings: Secondary | ICD-10-CM | POA: Diagnosis not present

## 2023-02-21 DIAGNOSIS — N3281 Overactive bladder: Secondary | ICD-10-CM | POA: Diagnosis not present

## 2023-02-21 DIAGNOSIS — M79672 Pain in left foot: Secondary | ICD-10-CM | POA: Diagnosis not present

## 2023-02-21 DIAGNOSIS — Z8719 Personal history of other diseases of the digestive system: Secondary | ICD-10-CM | POA: Diagnosis not present

## 2023-02-21 DIAGNOSIS — F985 Adult onset fluency disorder: Secondary | ICD-10-CM | POA: Diagnosis not present

## 2023-02-21 DIAGNOSIS — M1990 Unspecified osteoarthritis, unspecified site: Secondary | ICD-10-CM | POA: Diagnosis not present

## 2023-02-21 DIAGNOSIS — M8580 Other specified disorders of bone density and structure, unspecified site: Secondary | ICD-10-CM | POA: Diagnosis not present

## 2023-07-20 DIAGNOSIS — Z96611 Presence of right artificial shoulder joint: Secondary | ICD-10-CM | POA: Diagnosis not present

## 2023-07-20 DIAGNOSIS — Z96612 Presence of left artificial shoulder joint: Secondary | ICD-10-CM | POA: Diagnosis not present

## 2023-08-18 DIAGNOSIS — M25511 Pain in right shoulder: Secondary | ICD-10-CM | POA: Diagnosis not present

## 2023-12-02 DIAGNOSIS — N952 Postmenopausal atrophic vaginitis: Secondary | ICD-10-CM | POA: Diagnosis not present

## 2023-12-02 DIAGNOSIS — N895 Stricture and atresia of vagina: Secondary | ICD-10-CM | POA: Diagnosis not present

## 2023-12-02 DIAGNOSIS — Z01419 Encounter for gynecological examination (general) (routine) without abnormal findings: Secondary | ICD-10-CM | POA: Diagnosis not present

## 2024-02-03 DIAGNOSIS — H2513 Age-related nuclear cataract, bilateral: Secondary | ICD-10-CM | POA: Diagnosis not present

## 2024-02-03 DIAGNOSIS — H5203 Hypermetropia, bilateral: Secondary | ICD-10-CM | POA: Diagnosis not present

## 2024-02-14 DIAGNOSIS — Z1231 Encounter for screening mammogram for malignant neoplasm of breast: Secondary | ICD-10-CM | POA: Diagnosis not present

## 2024-02-23 DIAGNOSIS — R928 Other abnormal and inconclusive findings on diagnostic imaging of breast: Secondary | ICD-10-CM | POA: Diagnosis not present

## 2024-02-27 DIAGNOSIS — E875 Hyperkalemia: Secondary | ICD-10-CM | POA: Diagnosis not present

## 2024-02-27 DIAGNOSIS — M8580 Other specified disorders of bone density and structure, unspecified site: Secondary | ICD-10-CM | POA: Diagnosis not present

## 2024-02-27 DIAGNOSIS — R7989 Other specified abnormal findings of blood chemistry: Secondary | ICD-10-CM | POA: Diagnosis not present

## 2024-02-27 DIAGNOSIS — D649 Anemia, unspecified: Secondary | ICD-10-CM | POA: Diagnosis not present

## 2024-02-27 DIAGNOSIS — Z1212 Encounter for screening for malignant neoplasm of rectum: Secondary | ICD-10-CM | POA: Diagnosis not present

## 2024-03-05 DIAGNOSIS — Z0000 Encounter for general adult medical examination without abnormal findings: Secondary | ICD-10-CM | POA: Diagnosis not present

## 2024-03-05 DIAGNOSIS — F985 Adult onset fluency disorder: Secondary | ICD-10-CM | POA: Diagnosis not present

## 2024-03-05 DIAGNOSIS — M8580 Other specified disorders of bone density and structure, unspecified site: Secondary | ICD-10-CM | POA: Diagnosis not present

## 2024-03-05 DIAGNOSIS — Z1339 Encounter for screening examination for other mental health and behavioral disorders: Secondary | ICD-10-CM | POA: Diagnosis not present

## 2024-03-05 DIAGNOSIS — J309 Allergic rhinitis, unspecified: Secondary | ICD-10-CM | POA: Diagnosis not present

## 2024-03-05 DIAGNOSIS — N3281 Overactive bladder: Secondary | ICD-10-CM | POA: Diagnosis not present

## 2024-03-05 DIAGNOSIS — R600 Localized edema: Secondary | ICD-10-CM | POA: Diagnosis not present

## 2024-03-05 DIAGNOSIS — R030 Elevated blood-pressure reading, without diagnosis of hypertension: Secondary | ICD-10-CM | POA: Diagnosis not present

## 2024-03-05 DIAGNOSIS — E663 Overweight: Secondary | ICD-10-CM | POA: Diagnosis not present

## 2024-03-05 DIAGNOSIS — G43909 Migraine, unspecified, not intractable, without status migrainosus: Secondary | ICD-10-CM | POA: Diagnosis not present

## 2024-03-05 DIAGNOSIS — M1990 Unspecified osteoarthritis, unspecified site: Secondary | ICD-10-CM | POA: Diagnosis not present

## 2024-03-05 DIAGNOSIS — Z1331 Encounter for screening for depression: Secondary | ICD-10-CM | POA: Diagnosis not present

## 2024-03-12 DIAGNOSIS — H2513 Age-related nuclear cataract, bilateral: Secondary | ICD-10-CM | POA: Diagnosis not present

## 2024-04-12 DIAGNOSIS — L814 Other melanin hyperpigmentation: Secondary | ICD-10-CM | POA: Diagnosis not present

## 2024-04-12 DIAGNOSIS — L438 Other lichen planus: Secondary | ICD-10-CM | POA: Diagnosis not present

## 2024-04-12 DIAGNOSIS — D1801 Hemangioma of skin and subcutaneous tissue: Secondary | ICD-10-CM | POA: Diagnosis not present

## 2024-04-12 DIAGNOSIS — L820 Inflamed seborrheic keratosis: Secondary | ICD-10-CM | POA: Diagnosis not present

## 2024-04-12 DIAGNOSIS — L821 Other seborrheic keratosis: Secondary | ICD-10-CM | POA: Diagnosis not present

## 2024-04-12 DIAGNOSIS — D485 Neoplasm of uncertain behavior of skin: Secondary | ICD-10-CM | POA: Diagnosis not present

## 2024-04-12 DIAGNOSIS — L570 Actinic keratosis: Secondary | ICD-10-CM | POA: Diagnosis not present

## 2024-04-24 DIAGNOSIS — H2512 Age-related nuclear cataract, left eye: Secondary | ICD-10-CM | POA: Diagnosis not present

## 2024-04-24 DIAGNOSIS — H52202 Unspecified astigmatism, left eye: Secondary | ICD-10-CM | POA: Diagnosis not present

## 2024-04-24 DIAGNOSIS — H25812 Combined forms of age-related cataract, left eye: Secondary | ICD-10-CM | POA: Diagnosis not present

## 2024-04-24 DIAGNOSIS — Z961 Presence of intraocular lens: Secondary | ICD-10-CM | POA: Diagnosis not present
